# Patient Record
Sex: Female | Born: 1970 | Race: Black or African American | Hispanic: No | Marital: Single | Smoking: Never smoker
Health system: Southern US, Community
[De-identification: ages and names within clinical notes are randomized; demographics above are authoritative.]

## PROBLEM LIST (undated history)

## (undated) DIAGNOSIS — Z789 Other specified health status: Secondary | ICD-10-CM

---

## 1998-09-14 ENCOUNTER — Emergency Department (HOSPITAL_COMMUNITY): Admission: EM | Admit: 1998-09-14 | Discharge: 1998-09-14 | Payer: Self-pay | Admitting: Emergency Medicine

## 1999-07-10 ENCOUNTER — Encounter: Payer: Self-pay | Admitting: Emergency Medicine

## 1999-07-10 ENCOUNTER — Emergency Department (HOSPITAL_COMMUNITY): Admission: EM | Admit: 1999-07-10 | Discharge: 1999-07-10 | Payer: Self-pay | Admitting: Emergency Medicine

## 1999-11-07 ENCOUNTER — Emergency Department (HOSPITAL_COMMUNITY): Admission: EM | Admit: 1999-11-07 | Discharge: 1999-11-07 | Payer: Self-pay

## 2000-01-05 ENCOUNTER — Emergency Department (HOSPITAL_COMMUNITY): Admission: EM | Admit: 2000-01-05 | Discharge: 2000-01-05 | Payer: Self-pay | Admitting: Internal Medicine

## 2001-08-15 ENCOUNTER — Other Ambulatory Visit: Admission: RE | Admit: 2001-08-15 | Discharge: 2001-08-15 | Payer: Self-pay | Admitting: *Deleted

## 2001-09-17 ENCOUNTER — Emergency Department (HOSPITAL_COMMUNITY): Admission: EM | Admit: 2001-09-17 | Discharge: 2001-09-17 | Payer: Self-pay

## 2003-06-09 ENCOUNTER — Emergency Department (HOSPITAL_COMMUNITY): Admission: EM | Admit: 2003-06-09 | Discharge: 2003-06-09 | Payer: Self-pay | Admitting: Emergency Medicine

## 2005-05-11 ENCOUNTER — Emergency Department (HOSPITAL_COMMUNITY): Admission: EM | Admit: 2005-05-11 | Discharge: 2005-05-11 | Payer: Self-pay | Admitting: Emergency Medicine

## 2006-07-15 ENCOUNTER — Emergency Department (HOSPITAL_COMMUNITY): Admission: EM | Admit: 2006-07-15 | Discharge: 2006-07-15 | Payer: Self-pay | Admitting: Emergency Medicine

## 2006-11-17 ENCOUNTER — Emergency Department (HOSPITAL_COMMUNITY): Admission: EM | Admit: 2006-11-17 | Discharge: 2006-11-17 | Payer: Self-pay | Admitting: Family Medicine

## 2006-12-06 ENCOUNTER — Inpatient Hospital Stay (HOSPITAL_COMMUNITY): Admission: AD | Admit: 2006-12-06 | Discharge: 2006-12-06 | Payer: Self-pay | Admitting: Gynecology

## 2007-02-23 ENCOUNTER — Encounter (INDEPENDENT_AMBULATORY_CARE_PROVIDER_SITE_OTHER): Payer: Self-pay | Admitting: *Deleted

## 2007-02-23 ENCOUNTER — Ambulatory Visit: Payer: Self-pay | Admitting: Family Medicine

## 2008-07-10 ENCOUNTER — Inpatient Hospital Stay (HOSPITAL_COMMUNITY): Admission: AD | Admit: 2008-07-10 | Discharge: 2008-07-10 | Payer: Self-pay | Admitting: Gynecology

## 2008-08-07 ENCOUNTER — Ambulatory Visit: Payer: Self-pay | Admitting: Obstetrics & Gynecology

## 2008-08-14 ENCOUNTER — Ambulatory Visit (HOSPITAL_COMMUNITY): Admission: RE | Admit: 2008-08-14 | Discharge: 2008-08-14 | Payer: Self-pay | Admitting: Obstetrics & Gynecology

## 2008-08-21 ENCOUNTER — Other Ambulatory Visit: Admission: RE | Admit: 2008-08-21 | Discharge: 2008-08-21 | Payer: Self-pay | Admitting: Family Medicine

## 2008-08-21 ENCOUNTER — Ambulatory Visit: Payer: Self-pay | Admitting: Obstetrics and Gynecology

## 2008-09-11 ENCOUNTER — Ambulatory Visit: Payer: Self-pay | Admitting: Obstetrics and Gynecology

## 2009-07-18 ENCOUNTER — Emergency Department (HOSPITAL_COMMUNITY): Admission: EM | Admit: 2009-07-18 | Discharge: 2009-07-18 | Payer: Self-pay | Admitting: Emergency Medicine

## 2009-07-23 ENCOUNTER — Other Ambulatory Visit: Admission: RE | Admit: 2009-07-23 | Discharge: 2009-07-23 | Payer: Self-pay | Admitting: Obstetrics and Gynecology

## 2009-07-23 ENCOUNTER — Ambulatory Visit: Payer: Self-pay | Admitting: Obstetrics and Gynecology

## 2009-07-23 ENCOUNTER — Encounter (INDEPENDENT_AMBULATORY_CARE_PROVIDER_SITE_OTHER): Payer: Self-pay | Admitting: Obstetrics & Gynecology

## 2009-07-27 ENCOUNTER — Ambulatory Visit (HOSPITAL_COMMUNITY): Admission: RE | Admit: 2009-07-27 | Discharge: 2009-07-27 | Payer: Self-pay | Admitting: Family Medicine

## 2009-09-09 ENCOUNTER — Ambulatory Visit: Payer: Self-pay | Admitting: Obstetrics & Gynecology

## 2009-10-02 ENCOUNTER — Ambulatory Visit: Payer: Self-pay | Admitting: Obstetrics & Gynecology

## 2009-10-02 ENCOUNTER — Ambulatory Visit (HOSPITAL_COMMUNITY): Admission: RE | Admit: 2009-10-02 | Discharge: 2009-10-02 | Payer: Self-pay | Admitting: Obstetrics & Gynecology

## 2009-11-04 ENCOUNTER — Ambulatory Visit: Payer: Self-pay | Admitting: Obstetrics and Gynecology

## 2010-01-06 ENCOUNTER — Ambulatory Visit: Payer: Self-pay | Admitting: Obstetrics and Gynecology

## 2010-08-30 ENCOUNTER — Emergency Department (HOSPITAL_COMMUNITY): Admission: EM | Admit: 2010-08-30 | Discharge: 2010-08-30 | Payer: Self-pay | Admitting: Emergency Medicine

## 2010-09-01 ENCOUNTER — Ambulatory Visit: Payer: Self-pay | Admitting: Obstetrics and Gynecology

## 2010-09-01 ENCOUNTER — Inpatient Hospital Stay (HOSPITAL_COMMUNITY): Admission: AD | Admit: 2010-09-01 | Discharge: 2010-09-01 | Payer: Self-pay | Admitting: Obstetrics & Gynecology

## 2010-09-03 ENCOUNTER — Inpatient Hospital Stay (HOSPITAL_COMMUNITY): Admission: AD | Admit: 2010-09-03 | Discharge: 2010-09-03 | Payer: Self-pay | Admitting: Obstetrics & Gynecology

## 2010-09-03 ENCOUNTER — Ambulatory Visit: Payer: Self-pay | Admitting: Nurse Practitioner

## 2010-09-15 ENCOUNTER — Ambulatory Visit: Payer: Self-pay | Admitting: Obstetrics & Gynecology

## 2010-11-01 ENCOUNTER — Ambulatory Visit (HOSPITAL_COMMUNITY): Admission: RE | Admit: 2010-11-01 | Discharge: 2010-11-01 | Payer: Self-pay | Admitting: Family Medicine

## 2010-11-27 ENCOUNTER — Inpatient Hospital Stay (HOSPITAL_COMMUNITY)
Admission: AD | Admit: 2010-11-27 | Discharge: 2010-11-27 | Payer: Self-pay | Source: Home / Self Care | Admitting: Obstetrics & Gynecology

## 2010-11-28 ENCOUNTER — Inpatient Hospital Stay (HOSPITAL_COMMUNITY)
Admission: AD | Admit: 2010-11-28 | Discharge: 2010-11-28 | Payer: Self-pay | Source: Home / Self Care | Admitting: Obstetrics & Gynecology

## 2010-11-29 ENCOUNTER — Ambulatory Visit: Payer: Self-pay | Admitting: Family Medicine

## 2010-11-30 ENCOUNTER — Inpatient Hospital Stay (HOSPITAL_COMMUNITY)
Admission: AD | Admit: 2010-11-30 | Discharge: 2010-11-30 | Payer: Self-pay | Source: Home / Self Care | Admitting: Obstetrics and Gynecology

## 2010-12-01 ENCOUNTER — Ambulatory Visit (HOSPITAL_COMMUNITY)
Admission: AD | Admit: 2010-12-01 | Discharge: 2010-12-01 | Payer: Self-pay | Source: Home / Self Care | Admitting: Obstetrics and Gynecology

## 2010-12-16 ENCOUNTER — Ambulatory Visit: Payer: Self-pay | Admitting: Family Medicine

## 2010-12-26 HISTORY — PX: DILATION AND CURETTAGE OF UTERUS: SHX78

## 2011-01-15 ENCOUNTER — Ambulatory Visit (HOSPITAL_COMMUNITY)
Admission: AD | Admit: 2011-01-15 | Discharge: 2011-01-15 | Payer: Self-pay | Source: Home / Self Care | Attending: Obstetrics & Gynecology | Admitting: Obstetrics & Gynecology

## 2011-01-18 LAB — URINALYSIS, ROUTINE W REFLEX MICROSCOPIC
Bilirubin Urine: NEGATIVE
Hgb urine dipstick: NEGATIVE
Nitrite: NEGATIVE
Specific Gravity, Urine: >1.03 — ABNORMAL HIGH (ref 1.005–1.030)
Urine Glucose, Fasting: NEGATIVE mg/dL
pH: 5.5 (ref 5.0–8.0)

## 2011-01-18 LAB — RAPID URINE DRUG SCREEN, HOSP PERFORMED
Amphetamines: NOT DETECTED
Cocaine: NOT DETECTED
Opiates: POSITIVE — AB
Tetrahydrocannabinol: POSITIVE — AB

## 2011-01-18 LAB — CBC
HCT: 37.5 % (ref 36.0–46.0)
MCH: 32 pg (ref 26.0–34.0)
MCV: 91.7 fL (ref 78.0–100.0)
Platelets: 395 10*3/uL (ref 150–400)
RDW: 13.3 % (ref 11.5–15.5)

## 2011-01-18 LAB — COMPREHENSIVE METABOLIC PANEL
ALT: 18 U/L (ref 0–35)
AST: 15 U/L (ref 0–37)
CO2: 23 mEq/L (ref 19–32)
Chloride: 107 mEq/L (ref 96–112)
GFR calc non Af Amer: >60 mL/min (ref >60)
Sodium: 138 mEq/L (ref 135–145)

## 2011-01-18 LAB — DIFFERENTIAL
Eosinophils Absolute: 0 10*3/uL (ref 0.0–0.7)
Eosinophils Relative: 0 % (ref 0–5)
Lymphs Abs: 2.2 10*3/uL (ref 0.7–4.0)

## 2011-01-18 LAB — POCT PREGNANCY, URINE: Preg Test, Ur: NEGATIVE

## 2011-01-19 LAB — CBC
HCT: 34.3 % — ABNORMAL LOW (ref 36.0–46.0)
Hemoglobin: 11.5 g/dL — ABNORMAL LOW (ref 12.0–15.0)
MCH: 31.2 pg (ref 26.0–34.0)
MCV: 93 fL (ref 78.0–100.0)
RBC: 3.69 MIL/uL — ABNORMAL LOW (ref 3.87–5.11)

## 2011-01-26 ENCOUNTER — Other Ambulatory Visit: Payer: Self-pay | Admitting: Family Medicine

## 2011-01-26 ENCOUNTER — Ambulatory Visit (HOSPITAL_COMMUNITY)
Admission: RE | Admit: 2011-01-26 | Discharge: 2011-01-27 | Disposition: A | Discharge status: Home / Self Care | Payer: Medicaid Other | Source: Ambulatory Visit | Attending: Family Medicine | Admitting: Family Medicine

## 2011-01-26 DIAGNOSIS — D259 Leiomyoma of uterus, unspecified: Secondary | ICD-10-CM

## 2011-01-26 DIAGNOSIS — N920 Excessive and frequent menstruation with regular cycle: Secondary | ICD-10-CM

## 2011-01-26 DIAGNOSIS — N949 Unspecified condition associated with female genital organs and menstrual cycle: Secondary | ICD-10-CM | POA: Insufficient documentation

## 2011-01-26 HISTORY — PX: ABDOMINAL HYSTERECTOMY: SHX81

## 2011-01-27 LAB — CBC
MCH: 31.2 pg (ref 26.0–34.0)
MCHC: 33.1 g/dL (ref 30.0–36.0)
Platelets: 319 10*3/uL (ref 150–400)
RDW: 13.6 % (ref 11.5–15.5)

## 2011-01-31 NOTE — Op Note (Signed)
Morgan [name], Morgan [name] NO.:  [id number]  MEDICAL RECORD NO.:  [id number]           PATIENT TYPE:  O  LOCATION:  9318                          [facility]ILITY:  [facility]  PHYSICIAN:  Patrich Heinze S. Shawnie Pons, M.D.   DATE OF BIRTH:  [birth year]  DATE OF PROCEDURE:  01/26/2011 DATE OF DISCHARGE:                              OPERATIVE REPORT   PREOPERATIVE DIAGNOSES:  Fibroid uterus, recurrent menometrorrhagia.  POSTOPERATIVE DIAGNOSES:  Fibroid uterus, recurrent menometrorrhagia.  PROCEDURE:  A laparoscopic supracervical hysterectomy.  SURGEON:  Shelbie Proctor. Shawnie Pons, M.D.  ASSISTANT:  Lazaro Arms, M.D. and Lesly Dukes, M.D.  ANESTHESIA:  General local, Dr. Malen Gauze.  FINDINGS:  340 grams of fibroid uterus.  SPECIMEN:  Morcellated uterus to Pathology.  BLOOD LOSS:  450 mL.  COMPLICATIONS:  None immediately known.  REASON FOR PROCEDURE:  Briefly, the patient is a 40 year old, so underwent a NovaSure endometrial ablation in October 10.  She did well until about 6 months postop and she developed hematometra.  She has been drained approximately 4 times, and has recurrence with significant abdominal pain related to this hematometra and she desired more definitive treatment.  She was counselled regarding risks and benefits of this procedure including but not limited to bleeding, infection, injury to surrounding structures including bowel, bladder, or ureters. After leaving the cervix, continued monthly spotting.  The patient understood all these risks and agreed to proceed.  PROCEDURE:  The patient was taken to the OR where she was placed in dorsal lithotomy in [place] stirrups.  She was prepped and draped in the usual sterile fashion.  SCDs were in place, 1 g of Ancef had been infused.  A time-out was performed.  A Foley catheter was placed inside the bladder.  A speculum was placed inside the vagina.  The cervix was visualized, grasped anteriorly with a single-tooth tenaculum and  an acorn tenaculum was placed through the cervix and hooked to the aforementioned single-tooth.  Attention was then turned to the abdomen. A hernia was noted in the anterior abdominal wall.  A knife was used to make a incision in the abdomen at the umbilicus after injection of local anesthesia.  This incision was carried down to underlying fascia which was incised.  The peritoneal cavity was entered bluntly with a Kelly clamp.  Edges of the umbilical incision were then grasped and tied with a 0 Vicryl suture on a UR-6.  Hasson trocar was placed inside this incision.  The camera was inserted into the abdomen.  Two 10- mm Excel trocars were used to place right and left lower quadrant ports under direct visualization after injection with local anesthetic.  At this point, a single-tooth tenaculum was used to elevate the uterus.  The round ligament was taken on the patient's left side with the harmonic scalpel.  This was carried down to the bladder flap.  The anterior leaf of the broad ligament could be entered.  Hydrodissection was then performed and the harmonic was used to create a bladder flap across the front of the uterus.  The tubo-ovarian pedicle was taken down on this side until  the uterine artery was easily visualized.  Similarly, the patient's right round ligament was taken down with harmonic scalpel. The anterior leaf of the broad entered, hydrodissection done, and the harmonic used to create bladder flap from this side to the midline.  The bladder was markedly adherent in the midline.  Attention was then turned to the tubo-ovarian pedicle on this side which was taken down, some bleeding was encountered on this side and then the suction irrigator was not really working very well.  A midline suprapubic port was inserted  under direct visualization using an Excel trocar. Eventually, the bleeding did cease without any further intervention on this side.  The patient's right uterine  artery was visualized.  The harmonic scalpel was used to cauterize this until transection was made.  The patient's left uterine artery was very large and easily identified.  It was clamped and cauterized with the harmonic, up and down in several places high on the uterus.  However, once it was transected it began bleeding rather profusely and a laparoscopic vascular clip was used and excellent hemostasis was obtained.  From this point on, one more bite was taken down the broad ligament on each side with harmonic scalpel and then the corpus of the uterus was removed by drilling in with a harmonic, and then closing the blades anteriorly and posteriorly in about 4 bites. Once this was done, the left lower quadrant port was removed, and the morcellator was placed through this.  The uterus was removed in pieces, quite a few pieces were required to get the entirety of the uterus out.  Any extra pieces were then removed without difficulty.  Suction irrigation was done in the pelvis.  All pedicles appeared dry.  The acorn was visualized and it was felt that not much cervix was left.  A piece of Intercede was placed over the cervix.  The fascia closed in the left  lower quadrant where the morcellator had been and was closed with an  endostitch.   So all trocars were removed, and the umbilical Hasson trocar was removed as well.  The pneumoperitoneum was let down. Once at the umbilicus, two figure-of-eights were used to close the fascia at the umbilicus using the aforementioned 0 Vicryl on a UR-6. The skin was closed with 3-0 Vicryl on an X1 in a subcuticular fashion followed by Dermabond.  The other lower quadrant ports,  3 of them were closed with 3-0 Vicryl in a subcuticular fashion and Dermabond was placed over each of these incisions. The single-tooth tenaculum and acorn tenaculum were removed from the patient's cervix.  The patient tolerated the procedure well.  All instrument, needle and lap  counts were correct x2.  The patient was awakened and taken to recovery in stable condition.     Shelbie Proctor. Shawnie Pons, M.D.     TSP/MEDQ  D:  01/26/2011  T:  01/27/2011  Job:  [id number]  Electronically Signed by Tinnie Gens M.D. on 01/31/2011 09:07:57 AM

## 2011-01-31 NOTE — Discharge Summary (Signed)
  NAMESREYA, FROIO NO.:  [id number]  MEDICAL RECORD NO.:  [id number]           PATIENT TYPE:  O  LOCATION:  9318                          [facility]  [facility]  PHYSICIAN:  Issiac Jamar S. Shawnie Pons, M.D.   DATE OF BIRTH:  [birth year]  DATE OF ADMISSION:  01/26/2011 DATE OF DISCHARGE:  01/27/2011                              DISCHARGE SUMMARY   FINAL DIAGNOSES: 1. Recurrent hematometra. 2. Pelvic pain. 3. Fibroid uterus. 4. Obesity. 5. Umbilical hernia.  PERTINENT LABORATORY VALUES:  A preoperative hemoglobin of 11.5, postoperative hemoglobin of 8.8.  Negative urine pregnancy test. Negative MRSA screen.  Other pertinent findings include an enlarged uterus with multiple fibroids.  REASON FOR ADMISSION:  Briefly, please see H and P on the chart.  The patient is a 40 year old G2, P2 who underwent NovaSure ablation in October 2010.  She subsequently had recurrent hematometra that had to be drained x3 in the ED and OR.  She had worsening abdominal pain requiring Dilaudid continuously and was admitted for definitive surgery.  PROCEDURES:  The patient underwent laparoscopic supracervical hysterectomy.  HOSPITAL COURSE:  The patient was admitted for the above procedure. Please see separate dictated operative report for full details of this procedure.  Postoperatively, the patient was transferred to the floor. She remained afebrile, had excellent urine output with stable vital signs.  The patient on postoperative day #1 was tolerating a regular diet.  She had passed no flatus.  She was ambulating in the halls and had done so 5 times on this day.  She did tolerate breakfast and lunch with increasing gas but no nausea and no vomiting.  Her IV was discontinued as was her PCA and she desired to go home.  DISCHARGE DISPOSITION AND CONDITION:  The patient discharged home in good condition.  Followup will be in 3 weeks on February 16, 2011, at 3 o'clock p.m. with Dr. Shawnie Pons in the  GYN Clinic.  Discharge medications include resumption of Aleve, naproxen, sodium 220 mg by mouth twice daily, Dilaudid 2 mg one every 4 hours as needed for pain.  A new prescription for this was written for the patient for postoperative stuff. Additionally, the patient has received a note for work same as she will be out for at least up to 4 weeks secondary to her procedure.  The patient is instructed to return with fever, chills, severe abdominal pain, persistent nausea and vomiting.  All instructions were given to the patient as were prescriptions.     Shelbie Proctor. Shawnie Pons, M.D.     TSP/MEDQ  D:  01/27/2011  T:  01/28/2011  Job:  [id number]  Electronically Signed by Tinnie Gens M.D. on 01/31/2011 09:08:14 AM

## 2011-02-09 NOTE — Op Note (Signed)
  NAME:  Morgan [name], Morgan [name] NO.:  [id number]  MEDICAL RECORD NO.:  [id number]          PATIENT TYPE:  AMB  LOCATION:  [facility]                          [facility]:  [facility]  PHYSICIAN:  Lesly Dukes, M.D. DATE OF BIRTH:  June 23, 1971  DATE OF PROCEDURE:  01/15/2011 DATE OF DISCHARGE:  01/15/2011                              OPERATIVE REPORT   PREOPERATIVE DIAGNOSES:  A 40 year old female with hematometra, status post ablation with severe pelvic pain unresponsive to pain medication.  POSTOPERATIVE DIAGNOSES:  A 40 year old female with hematometra, status post ablation with severe pelvic pain unresponsive to pain medication.  PROCEDURE:  Dilation and curettage.  SURGEON:  Lesly Dukes, MD  ANESTHESIA:  General.  FINDINGS:  A 3.5-cm hematometra on ultrasound.  SPECIMENS:  Endometrial curettings to Pathology.  ESTIMATED BLOOD LOSS:  No EBL from procedure just a blood from the hematometra.  COMPLICATIONS:  None. PROCEDURE:  After informed consent was obtained, the patient was taken to the operating room and placed in dorsal lithotomy position.  The patient was prepped and draped with normal sterile fashion.  A time-out was done.  A bivalve speculum was placed in the patient's vagina.  The cervix was brought into view under ultrasound guidance.  The cervical os was dilated.  There was scarring at the internal os and this was broken through with the dilators.  Again, under ultrasound guidance, we dilated to a #7-1/2 Hegar dilator.  A 6 flexible suction curette was gently introduced into the uterus and suction curettage was performed.  The thick black old blood was easily suctioned out of the uterus.  There was no bleeding from the uterus with the procedure and endometrial stripe was very thin on ultrasound.  The patient tolerated the procedure well. Sponge, lap, instrument and needle count correct x2 and the patient went to the recovery room in stable condition.   The patient was going to receive Depo on discharge and has hysterectomy scheduled for January 26, 2011.     Lesly Dukes, M.D.     Lora Paula  D:  01/15/2011  T:  01/16/2011  Job:  [id number]  Electronically Signed by Elsie Lincoln M.D. on 02/09/2011 07:10:45 PM

## 2011-02-16 ENCOUNTER — Ambulatory Visit: Payer: Self-pay | Admitting: Family Medicine

## 2011-02-16 DIAGNOSIS — Z09 Encounter for follow-up examination after completed treatment for conditions other than malignant neoplasm: Secondary | ICD-10-CM

## 2011-03-07 LAB — CBC
HCT: 38.5 % (ref 36.0–46.0)
HCT: 40.1 % (ref 36.0–46.0)
Hemoglobin: 13 g/dL (ref 12.0–15.0)
Hemoglobin: 13.7 g/dL (ref 12.0–15.0)
MCH: 33 pg (ref 26.0–34.0)
MCV: 96.8 fL (ref 78.0–100.0)
RBC: 3.96 MIL/uL (ref 3.87–5.11)
RBC: 4.14 MIL/uL (ref 3.87–5.11)
WBC: 10.8 10*3/uL — ABNORMAL HIGH (ref 4.0–10.5)

## 2011-03-07 LAB — URINALYSIS, ROUTINE W REFLEX MICROSCOPIC
Bilirubin Urine: NEGATIVE
Ketones, ur: 15 mg/dL — AB
Leukocytes, UA: NEGATIVE
Nitrite: NEGATIVE
Protein, ur: NEGATIVE mg/dL

## 2011-03-07 LAB — URINE MICROSCOPIC-ADD ON

## 2011-03-07 LAB — POCT PREGNANCY, URINE: Preg Test, Ur: NEGATIVE

## 2011-03-10 LAB — COMPREHENSIVE METABOLIC PANEL
ALT: 18 U/L (ref 0–35)
AST: 16 U/L (ref 0–37)
Albumin: 3.8 g/dL (ref 3.5–5.2)
Alkaline Phosphatase: 52 U/L (ref 39–117)
Alkaline Phosphatase: 47 U/L (ref 39–117)
BUN: 13 mg/dL (ref 6–23)
Calcium: 9 mg/dL (ref 8.4–10.5)
Chloride: 105 mEq/L (ref 96–112)
GFR calc Af Amer: >60 mL/min (ref >60)
GFR calc non Af Amer: >60 mL/min (ref >60)
Glucose, Bld: 101 mg/dL — ABNORMAL HIGH (ref 70–99)
Potassium: 3.7 mEq/L (ref 3.5–5.1)
Sodium: 137 mEq/L (ref 135–145)
Total Bilirubin: 0.6 mg/dL (ref 0.3–1.2)
Total Protein: 8.4 g/dL — ABNORMAL HIGH (ref 6.0–8.3)
Total Protein: 8.4 g/dL — ABNORMAL HIGH (ref 6.0–8.3)

## 2011-03-10 LAB — URINALYSIS, ROUTINE W REFLEX MICROSCOPIC
Hgb urine dipstick: NEGATIVE
Specific Gravity, Urine: 1.019 (ref 1.005–1.030)
Urobilinogen, UA: 1 mg/dL (ref 0.0–1.0)

## 2011-03-10 LAB — DIFFERENTIAL
Basophils Absolute: 0.1 10*3/uL (ref 0.0–0.1)
Basophils Relative: 2 % — ABNORMAL HIGH (ref 0–1)
Basophils Relative: 1 % (ref 0–1)
Eosinophils Absolute: 0.1 10*3/uL (ref 0.0–0.7)
Eosinophils Relative: 1 % (ref 0–5)
Eosinophils Relative: 0 % (ref 0–5)
Lymphocytes Relative: 28 % (ref 12–46)
Monocytes Absolute: 0.6 10*3/uL (ref 0.1–1.0)
Monocytes Relative: 7 % (ref 3–12)
Neutrophils Relative %: 63 % (ref 43–77)

## 2011-03-10 LAB — CBC
HCT: 38.7 % (ref 36.0–46.0)
MCHC: 34.3 g/dL (ref 30.0–36.0)
MCV: 98.1 fL (ref 78.0–100.0)
MCV: 96.2 fL (ref 78.0–100.0)
Platelets: 337 10*3/uL (ref 150–400)
RBC: 3.99 MIL/uL (ref 3.87–5.11)
RDW: 14.3 % (ref 11.5–15.5)
RDW: 14 % (ref 11.5–15.5)
WBC: 8.9 10*3/uL (ref 4.0–10.5)

## 2011-03-16 ENCOUNTER — Ambulatory Visit: Payer: Self-pay | Admitting: Obstetrics & Gynecology

## 2011-03-18 NOTE — Progress Notes (Signed)
NAME:  Morgan [name], Morgan [name] NO.:  [id number]  MEDICAL RECORD NO.:  [id number]           PATIENT TYPE:  A  LOCATION:  [facility]                   FACILITY:  [facility]  PHYSICIAN:  Tinnie Gens, MD        DATE OF BIRTH:  Nov 01, 1971  DATE OF SERVICE:  02/16/2011                                 CLINIC NOTE  CHIEF COMPLAINT:  Postop followup.  HISTORY OF PRESENT ILLNESS:  The patient is a 40 year old gravida 2, para 2 who underwent supracervical laparoscopic supracervical hysterectomy for fibroid uterus and persistent hematometra who is here for a 2-week postop check.  She feels much better.  All of her pain has gone.  The uterus was approximately 326 g, came out fine.  Pathology is reviewed, shows leiomyoma, secretory-type endometrium, normal serosa.  On exam, incisions are healing well.  Abdomen is soft and flat.  IMPRESSION:  Status post laparoscopic supracervical hysterectomy for persistent hematometra following NovaSure ablation, doing well.  PLAN:  Follow up in 4 weeks for final postop check.          ______________________________ Tinnie Gens, MD    TP/MEDQ  D:  02/16/2011  T:  02/17/2011  Job:  [id number]

## 2011-03-31 LAB — CBC
HCT: 35.4 % — ABNORMAL LOW (ref 36.0–46.0)
MCHC: 34.1 g/dL (ref 30.0–36.0)
MCV: 97.4 fL (ref 78.0–100.0)
Platelets: 366 10*3/uL (ref 150–400)
WBC: 8.9 10*3/uL (ref 4.0–10.5)

## 2011-03-31 LAB — PREGNANCY, URINE: Preg Test, Ur: NEGATIVE

## 2011-04-03 LAB — LIPASE, BLOOD: Lipase: 19 U/L (ref 11–59)

## 2011-04-03 LAB — CBC
HCT: 37 % (ref 36.0–46.0)
Hemoglobin: 12.3 g/dL (ref 12.0–15.0)
MCHC: 33.1 g/dL (ref 30.0–36.0)
MCV: 97.2 fL (ref 78.0–100.0)
Platelets: 313 K/uL (ref 150–400)
RBC: 3.81 MIL/uL — ABNORMAL LOW (ref 3.87–5.11)
RDW: 14 % (ref 11.5–15.5)
WBC: 7.9 K/uL (ref 4.0–10.5)

## 2011-04-03 LAB — COMPREHENSIVE METABOLIC PANEL WITH GFR
ALT: 15 U/L (ref 0–35)
AST: 17 U/L (ref 0–37)
Albumin: 3.4 g/dL — ABNORMAL LOW (ref 3.5–5.2)
Alkaline Phosphatase: 46 U/L (ref 39–117)
BUN: 12 mg/dL (ref 6–23)
CO2: 25 meq/L (ref 19–32)
Calcium: 8.9 mg/dL (ref 8.4–10.5)
Chloride: 110 meq/L (ref 96–112)
Creatinine, Ser: 0.91 mg/dL (ref 0.4–1.2)
GFR calc Af Amer: >60 mL/min (ref >60)
GFR calc non Af Amer: >60 mL/min (ref >60)
Glucose, Bld: 92 mg/dL (ref 70–99)
Potassium: 3.8 meq/L (ref 3.5–5.1)
Sodium: 140 meq/L (ref 135–145)
Total Bilirubin: 0.4 mg/dL (ref 0.3–1.2)
Total Protein: 7.1 g/dL (ref 6.0–8.3)

## 2011-04-03 LAB — DIFFERENTIAL
Basophils Absolute: 0.1 K/uL (ref 0.0–0.1)
Basophils Relative: 2 % — ABNORMAL HIGH (ref 0–1)
Eosinophils Absolute: 0.1 K/uL (ref 0.0–0.7)
Eosinophils Relative: 1 % (ref 0–5)
Lymphocytes Relative: 40 % (ref 12–46)
Lymphs Abs: 3.2 K/uL (ref 0.7–4.0)
Monocytes Absolute: 0.8 K/uL (ref 0.1–1.0)
Monocytes Relative: 10 % (ref 3–12)
Neutro Abs: 3.8 K/uL (ref 1.7–7.7)
Neutrophils Relative %: 48 % (ref 43–77)

## 2011-04-03 LAB — POCT PREGNANCY, URINE: Preg Test, Ur: NEGATIVE

## 2011-05-10 NOTE — Group Therapy Note (Signed)
NAME:  QUINCEE, GITTENS NO.:  [id number]   MEDICAL RECORD NO.:  [id number]          PATIENT TYPE:  WOC   LOCATION:  [facility]                   FACILITY:  [facility]   PHYSICIAN:  Argentina Donovan, MD        DATE OF BIRTH:  14-Aug-1971   DATE OF SERVICE:  08/21/2008                                  CLINIC NOTE   The patient is a 40 year old para 2-0-0-2, post tubal ligation, who has  had heavy vaginal bleeding, was seen on August 07, 2008, by Dr. Penne Lash,  who had a CBC and TSH on the patient, the TSH was normal.  The CBC I  cannot find on the chart.  The patient was to come back today for the  results of her ultrasound which was essentially normal with the  exception of some small leiomyomata intramural on the uterus and what  appeared to be a normal endometrial cavity premenstrual and she had her  come back today for an endometrial biopsy which was done without  incident with the uterus sounding 7 cm.  She will come back in 2 weeks  for that.  Meanwhile, she put her on Loestrin 24 two a day and the  patient is still spotting lightly every day, so hopefully we can make  some kind of a decision when she comes back.  She may be a good  candidate for endometrial ablation.           ______________________________  Argentina Donovan, MD     PR/MEDQ  D:  08/21/2008  T:  08/21/2008  Job:  [id number]

## 2011-05-10 NOTE — Group Therapy Note (Signed)
NAME:  Morgan [name], Morgan [name] NO.:  [id number]   MEDICAL RECORD NO.:  [id number]          PATIENT TYPE:  WOC   LOCATION:  [facility]                   [facility]ACILITY:  [facility]   PHYSICIAN:  Elsie Lincoln, MD      DATE OF BIRTH:  1971/06/12   DATE OF SERVICE:  08/07/2008                                  CLINIC NOTE   The patient is a 40 year old para 2 female status post BTL who is a  follow-up from [facility] for heavy vaginal bleeding.  The patient has had  normal periods her whole life and actually was seen at the [facility] back in  2008 for the same problem.  She was placed on Femcon FE and did well  until she stopped a year later then the irregular bleeding began again.  She was placed on a 30 mcg pill but has been bleeding on and off through  that pack and she is now on the placebo pills and bleeding.  The patient  is done with childbearing but needs something done with her bleeding.  She is not interested in a hysterectomy at this time.  Given that she is  morbidly obese and has a history of abnormal periods for most of her  life, she does need an endometrial biopsy.  She is very scared and wants  to come back in 2-3 weeks for this.  In the meantime, we will get a CBC,  TSH and transvaginal ultrasound.  A transvaginal ultrasound several  years ago showed 2 small fibroids.   PAST MEDICAL HISTORY:  None.   PAST SURGICAL HISTORY:  None.   OBSTETRICAL HISTORY:  NSVD x1, C-section x1.   GYN HISTORY:  Denies abnormal Pap smears, does have fibroids, no ovarian  cysts.   FAMILY HISTORY:  Emelia Loron has diabetes.  High blood pressure is in  her grandfather and grandmother died of some type of cancer.   SOCIAL HISTORY:  She does work.  She drinks approximately 1 alcoholic  beverage a week.  No drugs or tobacco.   SYSTEMIC REVIEW:  Positive for weight gain.   MEDICATION:  Birth control pills called for Cryselle, which is a generic  birth control pill.   ALLERGIES:  None.   GENERAL:   Well-nourished, well-developed, no apparent distress.  Temperature 97.9, pulse 106, blood pressure 147/88 weight 254.9.  GENERAL:  Well-nourished, well-developed no apparent distress.  HEENT:  Normocephalic, atraumatic.  CHEST:  Normal movement.  ABDOMEN:  Obese, soft, nontender, no organomegaly, no hernia but  difficult to palpate anything secondary to habitus.  GENITALIA:  Tanner 5.  Vagina pink, normal rugae.  Bladder and urethra  well suspended.  Cervix closed, nontender.  Uterus difficult to palpate,  does not feel overly enlarged.  Adnexa no masses, nontender but again  difficult to palpate secondary to habitus.   ASSESSMENT/PLAN:  A 39 year old para 2 female with dysfunctional uterine  bleeding not doing well on birth control pills.  1. Endometrial biopsy needed.  2. CBC, TSH.  3. Transvaginal ultrasound.  4. The patient does not have insurance and only pills we have is  Loestrin Fe 24 and I am going to start her on this 2 pills a day, 1      in the morning and 1 at night, until she comes back for her      endometrial biopsy.  I am also going to start her on regular Iron      and Colace.  She is actively bleeding and at this point it seems      that she is anemic.  If she is not anemic now, will tell her to      stop the Iron.  5. The patient is to come back to 2-3 weeks for an endometrial biopsy      and go over test results.           ______________________________  Elsie Lincoln, MD     KL/MEDQ  D:  08/07/2008  T:  08/07/2008  Job:  [id number]

## 2011-05-10 NOTE — Group Therapy Note (Signed)
NAME:  Morgan [name], MCCAMMON NO.:  [id number]   MEDICAL RECORD NO.:  [id number]          PATIENT TYPE:  WOC   LOCATION:  [facility]                   [facility]ACILITY:  [facility]   PHYSICIAN:  Dorthula Perfect, MD     DATE OF BIRTH:  04/04/1971   DATE OF SERVICE:  07/23/2009                                  CLINIC NOTE   HISTORY OF PRESENT ILLNESS:  A 40 year old African American female  gravida 2, para 2 returns with an ongoing problem of heavy menstrual  periods.  She have seen here often on in the past.  She was seen here in  August 2009 by Dr. Tenny Craw.  She had an ultrasound done at that time, which  revealed an  enlarged uterus with a vaginal length of 13.5 cm.  She had  2 fibroids, one in the fundus measured 3.3 x 4.8 x 3.5 cm and the other  one was about half that size.  These fibroids had almost doubled in size  from the previous one in 2007.  In the past, she had been managed with  low-dose birth control pills, which do help keep her cyclic, does help  the bleeding somewhat, but she does not like the way she feels on the  pill.  This past year, off of the birth control pills she could be up to  having a 2-3 months interval between her periods.  Her menstrual periods  last approximately a week, the first 4-5 days are heavy.  She however  has no staining of her bed sheets.  Last menstrual period was June 23, 2009.  She has no intermenstrual spotting.  She has been brought by the  physicians in her other visits to be a good candidate for an endometrial  ablation.   She has no GI or GU complaints.   PAST SURGICAL HISTORY:  She has C-section with a tubal sterilization  with her second delivery.   ALLERGIES:  She has no allergies.   MEDICATIONS:  She is on no medication.   PHYSICAL EXAMINATION:  VITAL SIGNS:  Height 5 feet, 2, weight 262, blood  pressure 148/102, repeat blood pressure was 129/78.  A blood pressure  here a year or two ago was normal.  ABDOMEN:  Examination of  her abdomen reveals her to be obese with a  fairly significant panniculus.  No masses are felt.  PELVIC:  External genitalia, BUS and Skene glands are normal.  Vaginal  vault epithelialized.  The cervix, which was easily visualized was  normal.  The uterus was probably slightly enlarged, but this is very  difficult to feel because of the abdominal size.  I could not palpate  either ovary.  There were no adnexal masses noted.   IMPRESSION:  1. Abnormal uterine bleeding with significant dysmenorrhea.  2. Uterine fibroids.   DISPOSITION:  1. Pap smears since her last one was a couple years ago.  2. Endometrial biopsy simply to in order to have on record that      everything is okay, in case she has an ablation done.  The anterior  lip of the cervix was grasped with single tooth tenaculum.      Endocervical canal was sounded and then a tow like instrument      inserted to get about 12 cm.  She have a fair amount of cramping      with the procedure and has received ibuprofen.  3. She will have another ultrasound to check the size of these      fibroids.  4. She will return in 2 weeks for both biopsy result and ultrasound      result.  5. I discussed with her various options including birth control pills,      which would keep her cyclic, probably help with periods and would      help with her cramps.  Also the possibility of slight control with      Provera, which might help the amount of menstrual flow.  She would      possibly benefit from an endometrial ablation, although the uterus      size I suspect is getting is enlarged.  I explained to her what      hysterectomy means and that it is not necessarily removal of her      ovaries what she was fearful of because she is hesitant about      having that procedure done.           ______________________________  Dorthula Perfect, MD     ER/MEDQ  D:  07/23/2009  T:  07/24/2009  Job:  [id number]

## 2011-09-05 IMAGING — US US INTRAOPERATIVE - NO REPORT
1 series · 2 of 2 positions shown · non-contrast
Comparison: none

[Series 1: us intraoperative - no report · 0.21mm/px · 2 of 2 slices shown]
[im 1/2]
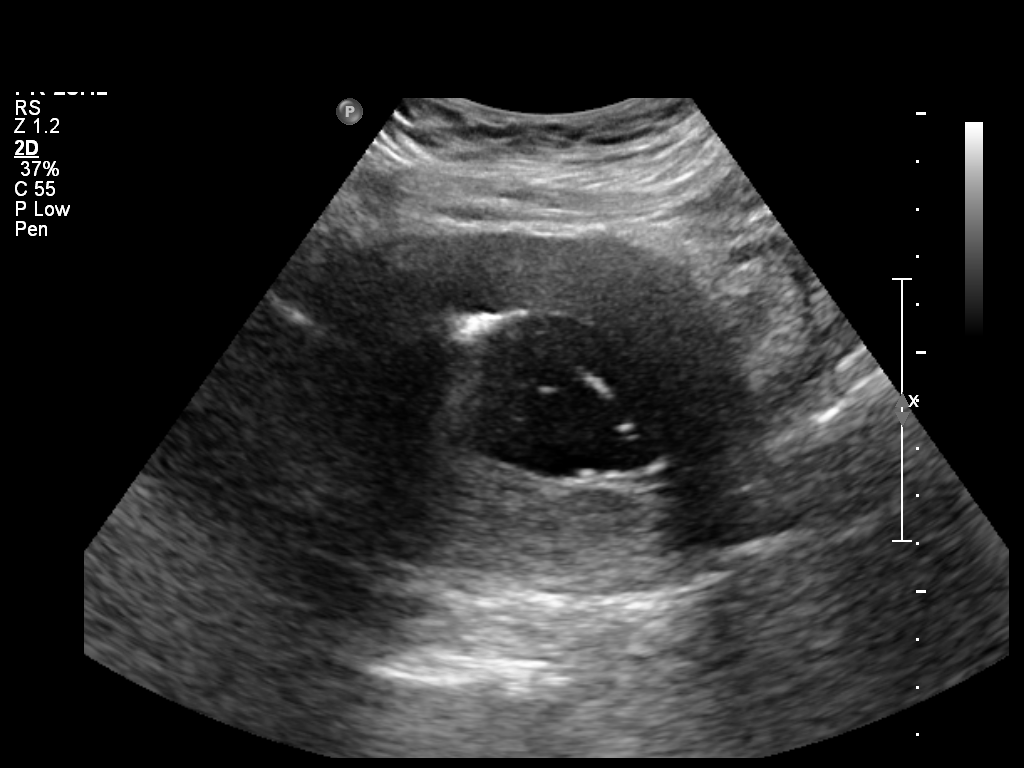
[im 2/2]
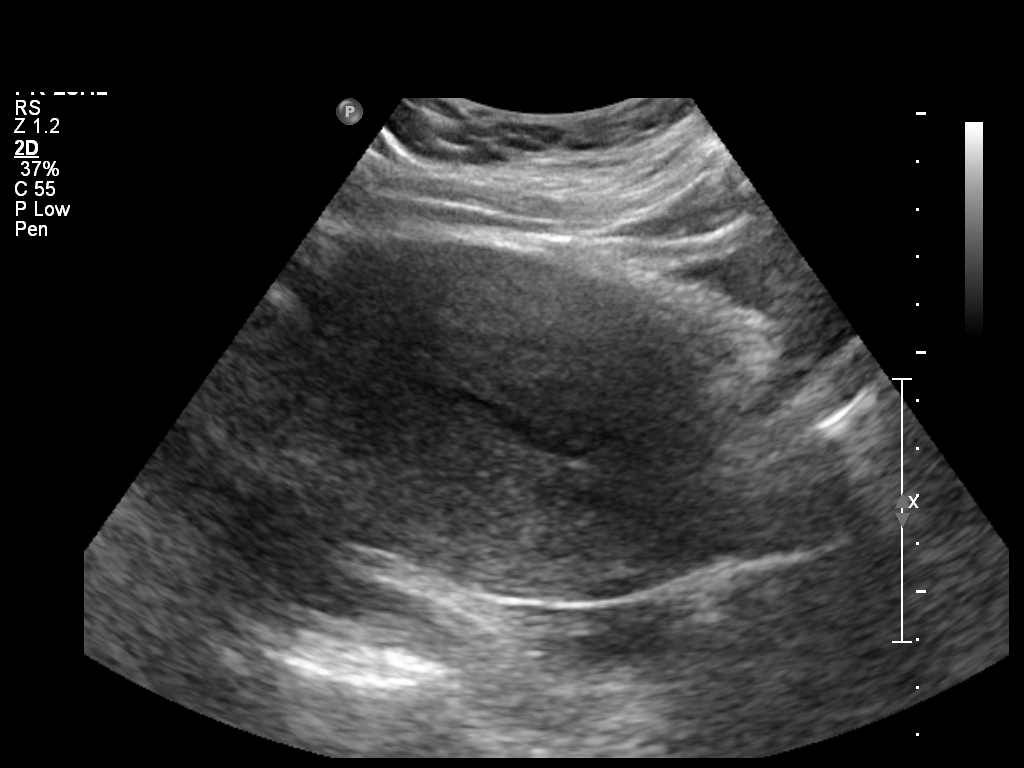

[2 of 2 positions shown; findings below may reference images not displayed]

Canned report from images found in remote index.

Refer to host system for actual result text.

## 2011-09-05 IMAGING — US US TRANSVAGINAL NON-OB
1 series · 13 of 25 positions shown · non-contrast
Comparison: 11/27/2010

CLINICAL DATA: Pelvic pain for 2 days.  Nausea, vomiting.  History
of anatomy tract.  History of uterine ablation of the 3343.  Prior
C-section.  Gravida 2, para 2.  History of hydrosalpinx.

TRANSVAGINAL ULTRASOUND OF PELVIS
TECHNIQUE: Transvaginal ultrasound examination of the pelvis was
performed including evaluation of the uterus, ovaries, adnexal
regions, and pelvic cul-de-sac.

[Series 1: us transvaginal non-ob · 0.12mm/px · 13 of 40 slices shown]
[im 1/40]
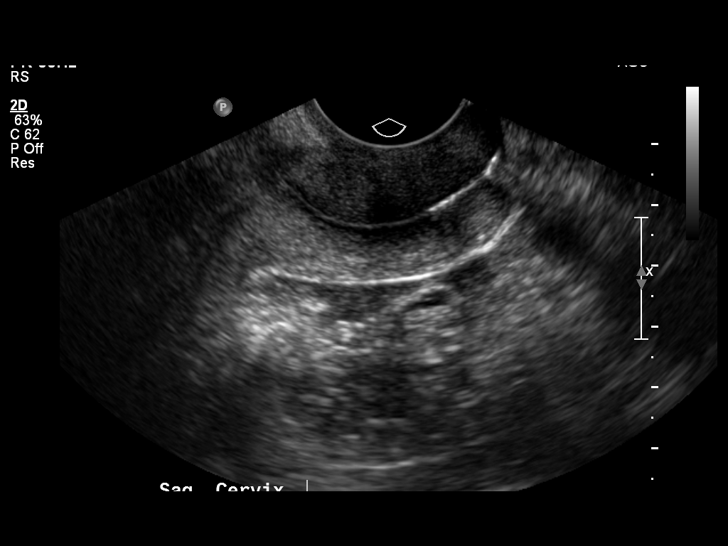
[im 4/40]
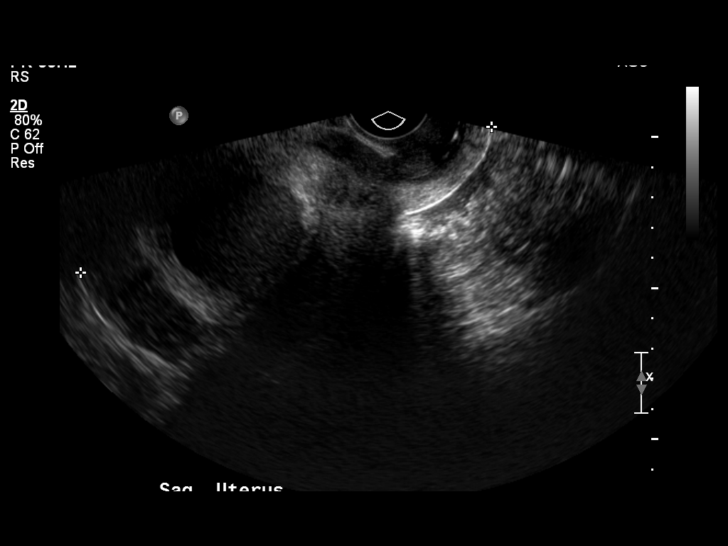
[im 7/40]
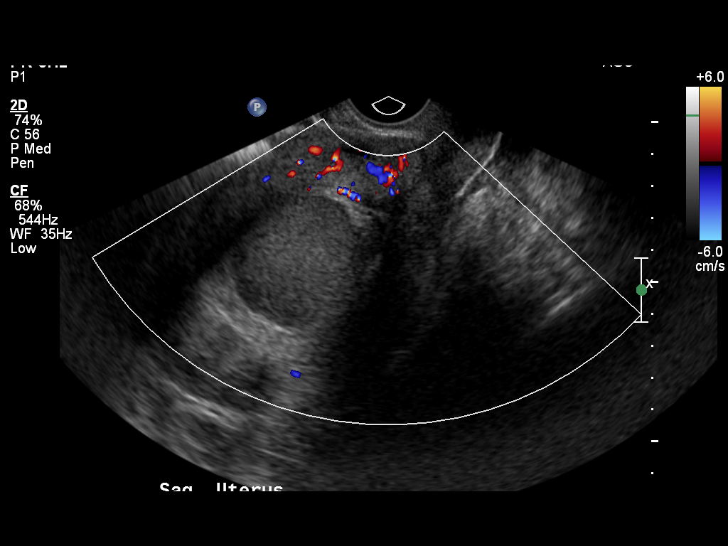
[im 10/40]
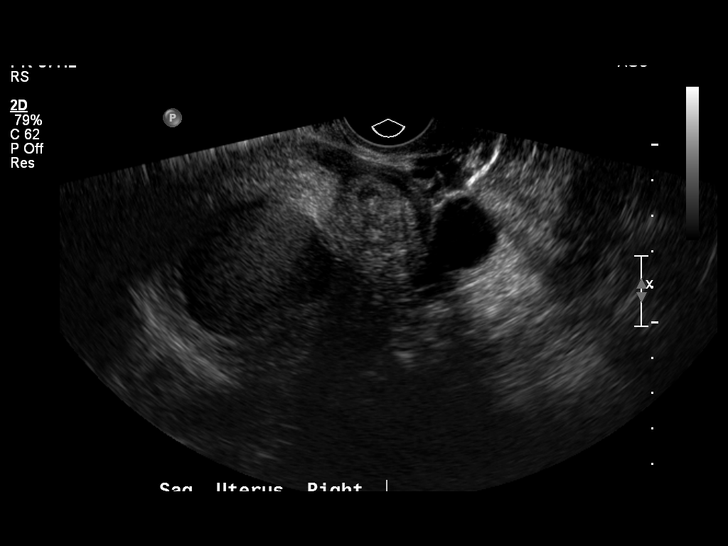
[im 14/40]
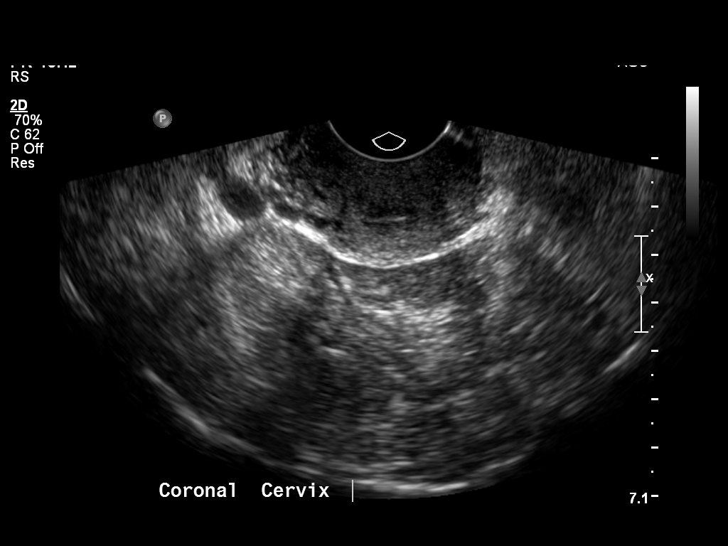
[im 17/40]
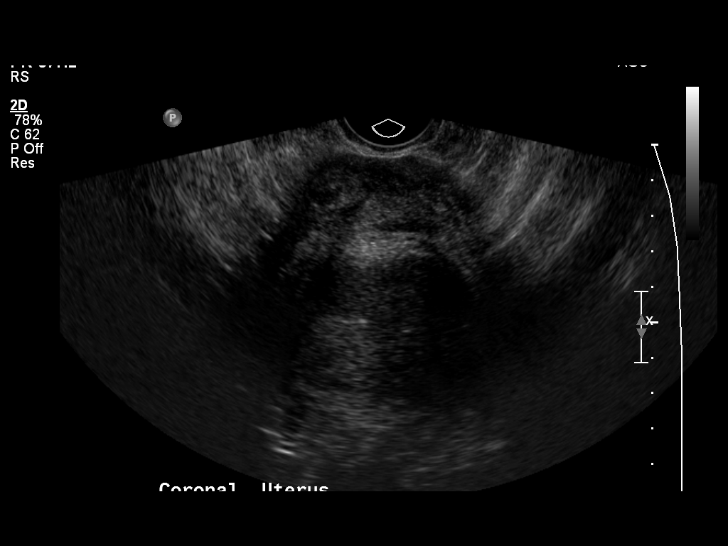
[im 20/40]
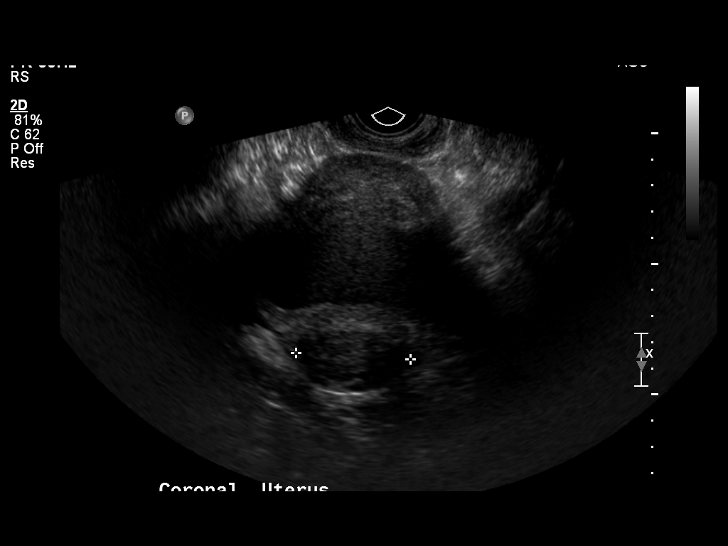
[im 23/40]
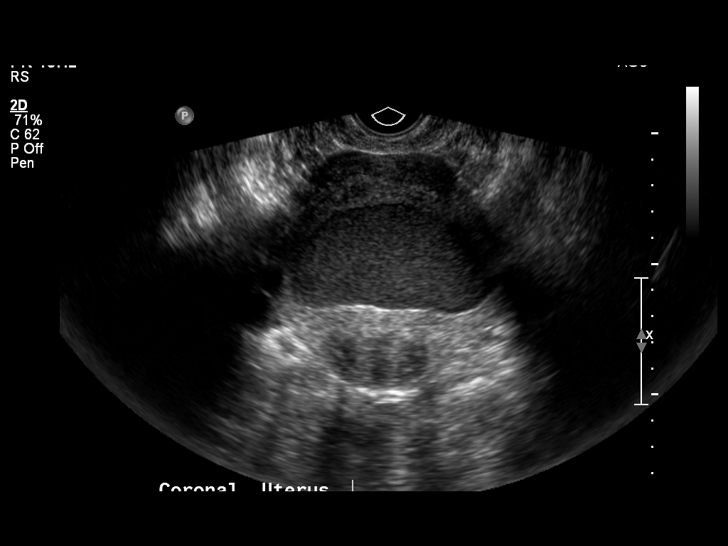
[im 27/40]
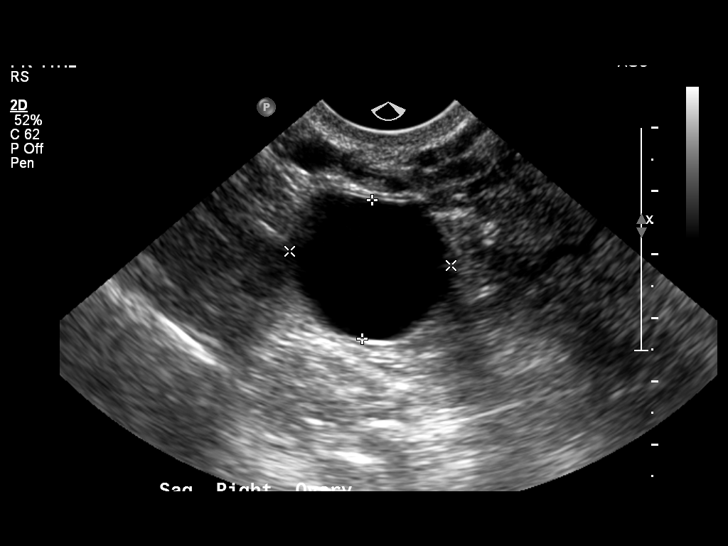
[im 30/40]
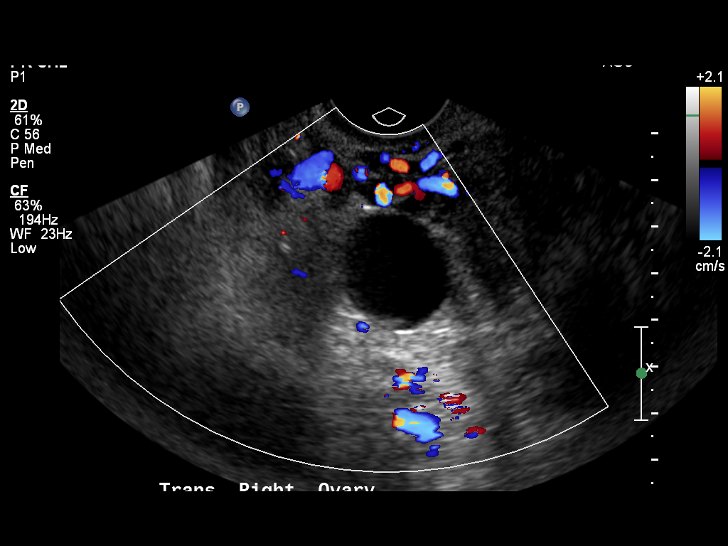
[im 33/40]
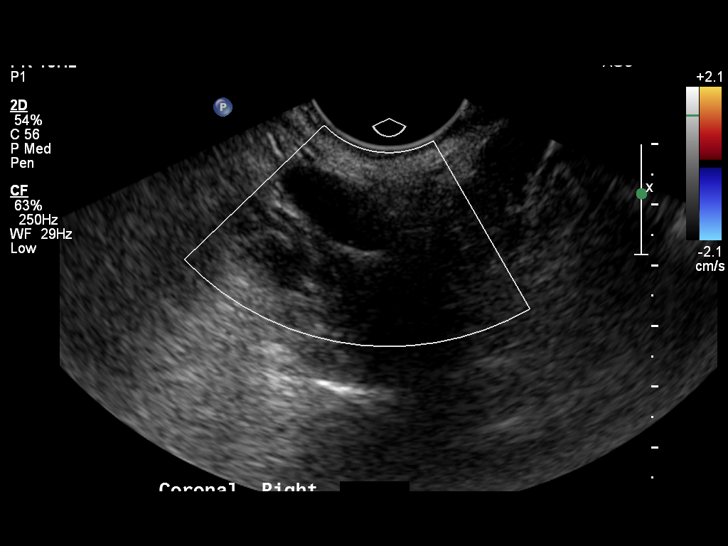
[im 36/40]
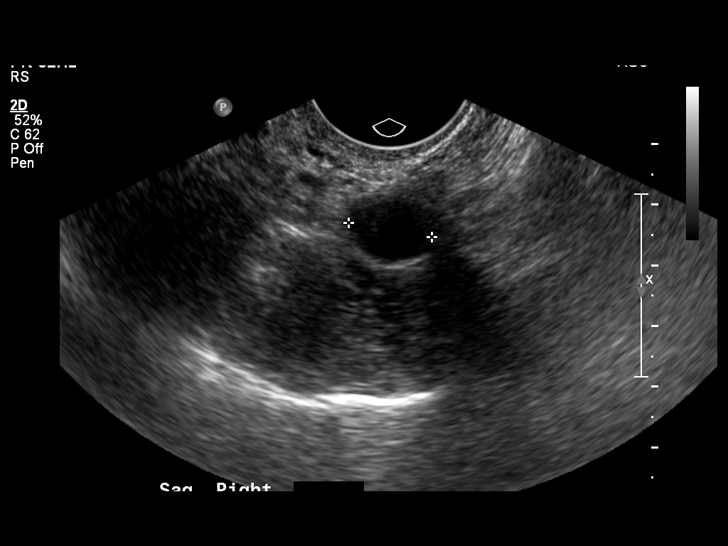
[im 40/40]
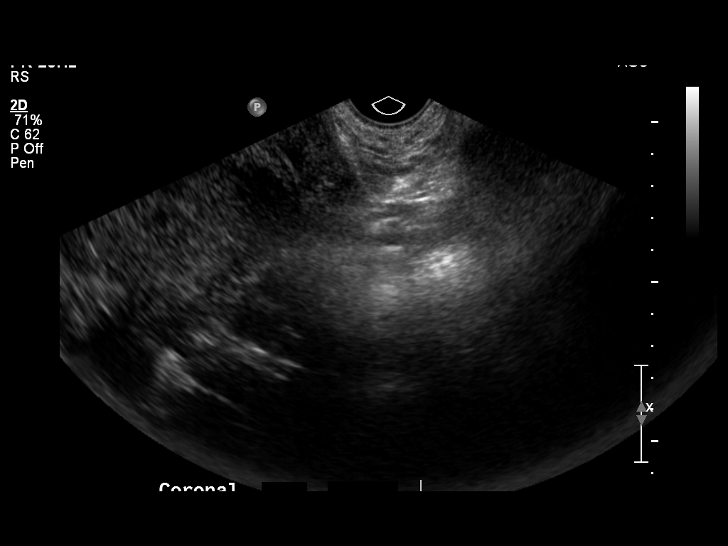

[13 of 25 positions shown; findings below may reference images not displayed]

FINDINGS: Uterus the uterus is 4-0.4 x 7.7 x 7.7 cm.  Lower uterine segment
fibroid measures 2.4 x 2.3 x 2.1 cm.  Fundal fibroid is 4.4 x 4.7 x
2.2 cm.

Endometrium Within the central aspect the uterus, there is a
collection of complex fluid in the endometrial canal measuring
cm.  Previously, this measured 2.4 x 1.5 x 1.9 cm.

Right Ovary right ovary is 3.4 x 2.2 x 3.0 cm.  A simple
follicle/cyst is 2.2 x 2.6 x 2.7 cm.  A para ovarian cyst is 1.9 x
1.0 x 1.4 cm.

Left Ovary left ovary is not visualized.

Other Findings:  No free pelvic fluid identified.  Bilateral
hydrosalpinges are not seen on today's exam.
IMPRESSION: 1.  Progressive collection of endometrial canal fluid, consistent
with myometrium.  Presence of the lower uterine segment fibroid
could be contributing to collection within the endometrial canal.
2.  Simple follicles / cyst in the right ovary.
3.  Bilateral known hydrosalpinges are not imaged today, likely
related to enlarged uterus and endovaginal technique used on
today's exam.

## 2011-09-23 LAB — URINALYSIS, ROUTINE W REFLEX MICROSCOPIC
Glucose, UA: NEGATIVE
Leukocytes, UA: NEGATIVE
Nitrite: NEGATIVE
Specific Gravity, Urine: 1.025
pH: 5.5

## 2011-09-23 LAB — URINE MICROSCOPIC-ADD ON

## 2011-09-23 LAB — GC/CHLAMYDIA PROBE AMP, GENITAL
Chlamydia, DNA Probe: NEGATIVE
GC Probe Amp, Genital: NEGATIVE

## 2011-09-23 LAB — WET PREP, GENITAL: Trich, Wet Prep: NONE SEEN

## 2013-03-18 ENCOUNTER — Other Ambulatory Visit (HOSPITAL_COMMUNITY): Payer: Self-pay | Admitting: Obstetrics

## 2013-03-18 DIAGNOSIS — Z1231 Encounter for screening mammogram for malignant neoplasm of breast: Secondary | ICD-10-CM

## 2013-04-01 ENCOUNTER — Ambulatory Visit (HOSPITAL_COMMUNITY): Payer: Medicaid Other | Attending: Obstetrics

## 2013-04-22 ENCOUNTER — Encounter (INDEPENDENT_AMBULATORY_CARE_PROVIDER_SITE_OTHER): Payer: Self-pay | Admitting: Surgery

## 2013-04-22 ENCOUNTER — Ambulatory Visit (INDEPENDENT_AMBULATORY_CARE_PROVIDER_SITE_OTHER): Payer: Medicaid Other | Admitting: Surgery

## 2013-04-22 VITALS — BP 132/84 | HR 80 | Temp 97.9°F | Resp 18 | Ht 62.0 in | Wt 266.4 lb

## 2013-04-22 DIAGNOSIS — K439 Ventral hernia without obstruction or gangrene: Secondary | ICD-10-CM

## 2013-04-22 NOTE — Patient Instructions (Signed)
Return after CT. 

## 2013-04-22 NOTE — Progress Notes (Signed)
Patient ID: Morgan [name], female   DOB: June 20, 1971, 42 y.o.   MRN: [id number]  Chief Complaint  Patient presents with  . New Evaluation    eval hernia    HPI Morgan [name] is a 42 y.o. female.  Patient sent at request of Dr. Shon Hough for abdominal wall hernia. She's had it for "many years". It does cause mild discomfort. It is getting larger. No constipation, nausea or vomiting. Does not smoke cigarettes. No redness or drainage. HPI  History reviewed. No pertinent past medical history.  Past Surgical History  Procedure Laterality Date  . Abdominal hysterectomy  02/04/2012    Partial    History reviewed. No pertinent family history.  Social History History  Substance Use Topics  . Smoking status: Never Smoker   . Smokeless tobacco: Never Used  . Alcohol Use: Yes     Comment: Social.    No Known Allergies  No current outpatient prescriptions on file.   No current facility-administered medications for this visit.    Review of Systems Review of Systems  Constitutional: Negative for fever, chills and unexpected weight change.  HENT: Negative for hearing loss, congestion, sore throat, trouble swallowing and voice change.   Eyes: Negative for visual disturbance.  Respiratory: Negative for cough and wheezing.   Cardiovascular: Negative for chest pain, palpitations and leg swelling.  Gastrointestinal: Negative for nausea, vomiting, abdominal pain, diarrhea, constipation, blood in stool, abdominal distention and anal bleeding.  Genitourinary: Negative for hematuria, vaginal bleeding and difficulty urinating.  Musculoskeletal: Negative for arthralgias.  Skin: Negative for rash and wound.  Neurological: Negative for seizures, syncope and headaches.  Hematological: Negative for adenopathy. Does not bruise/bleed easily.  Psychiatric/Behavioral: Negative for confusion.    Blood pressure 132/84, pulse 80, temperature 97.9 F (36.6 C), temperature source Temporal, resp. rate  18, height 5\' 2"  (1.575 m), weight 266 lb 6.4 oz (120.838 kg).  Physical Exam Physical Exam  Constitutional: She is oriented to person, place, and time. She appears well-developed and well-nourished.  HENT:  Head: Normocephalic and atraumatic.  Eyes: EOM are normal. Pupils are equal, round, and reactive to light.  Neck: Normal range of motion. Neck supple.  Cardiovascular: Normal rate and regular rhythm.   Pulmonary/Chest: Effort normal and breath sounds normal.  Abdominal: Soft. There is no tenderness.    Musculoskeletal: Normal range of motion.  Neurological: She is alert and oriented to person, place, and time.  Skin: Skin is warm and dry.  Psychiatric: She has a normal mood and affect. Her behavior is normal. Thought content normal.      Assessment    Ventral hernia    Plan    Recommend CT scan to further evaluate to see if this is amendable to laparoscopic repair. Return to clinic after evaluation to decide if laparoscopic or open is more appropriate.       Random Dobrowski A. 04/22/2013, 11:48 AM

## 2013-04-25 ENCOUNTER — Ambulatory Visit
Admission: RE | Admit: 2013-04-25 | Discharge: 2013-04-25 | Disposition: A | Discharge status: Home / Self Care | Payer: Medicaid Other | Source: Ambulatory Visit | Attending: Surgery | Admitting: Surgery

## 2013-04-25 DIAGNOSIS — K439 Ventral hernia without obstruction or gangrene: Secondary | ICD-10-CM

## 2013-04-25 MED ORDER — IOHEXOL 300 MG/ML  SOLN
125.0000 mL | Freq: Once | INTRAMUSCULAR | Status: AC | PRN
  Administered 2013-04-25: 125 mL via INTRAVENOUS

## 2013-05-13 ENCOUNTER — Encounter (INDEPENDENT_AMBULATORY_CARE_PROVIDER_SITE_OTHER): Payer: Self-pay | Admitting: Surgery

## 2013-05-13 ENCOUNTER — Ambulatory Visit (INDEPENDENT_AMBULATORY_CARE_PROVIDER_SITE_OTHER): Payer: Medicaid Other | Admitting: Surgery

## 2013-05-13 VITALS — BP 130/84 | HR 95 | Temp 97.4°F | Resp 18 | Ht 62.0 in | Wt 264.2 lb

## 2013-05-13 DIAGNOSIS — K436 Other and unspecified ventral hernia with obstruction, without gangrene: Secondary | ICD-10-CM

## 2013-05-13 NOTE — Patient Instructions (Signed)
Hernia Repair with Laparoscope A hernia occurs when an internal organ pushes out through a weak spot in the belly (abdominal) wall muscles. Hernias most commonly occur in the groin and around the navel. Hernias can also occur through a cut by the surgeon (incision) after an abdominal operation. A hernia may be caused by:  Lifting heavy objects.  Prolonged coughing.  Straining to move your bowels. Hernias can often be pushed back into place (reduced). Most hernias tend to get worse over time. Problems occur when abdominal contents get stuck in the opening and the blood supply is blocked or impaired (incarcerated hernia). Because of these risks, you require surgery to repair the hernia. Your hernia will be repaired using a laparoscope. Laparoscopic surgery is a type of minimally invasive surgery. It does not involve making a typical surgical cut (incision) in the skin. A laparoscope is a telescope-like rod and lens system. It is usually connected to a video camera and a light source so your caregiver can clearly see the operative area. The instruments are inserted through  to  inch (5 mm or 10 mm) openings in the skin at specific locations. A working and viewing space is created by blowing a small amount of carbon dioxide gas into the abdominal cavity. The abdomen is essentially blown up like a balloon (insufflated). This elevates the abdominal wall above the internal organs like a dome. The carbon dioxide gas is common to the human body and can be absorbed by tissue and removed by the respiratory system. Once the repair is completed, the small incisions will be closed with either stitches (sutures) or staples (just like a paper stapler only this staple holds the skin together). LET YOUR CAREGIVERS KNOW ABOUT:  Allergies.  Medications taken including herbs, eye drops, over the counter medications, and creams.  Use of steroids (by mouth or creams).  Previous problems with anesthetics or  Novocaine.  Possibility of pregnancy, if this applies.  History of blood clots (thrombophlebitis).  History of bleeding or blood problems.  Previous surgery.  Other health problems. BEFORE THE PROCEDURE  Laparoscopy can be done either in a hospital or out-patient clinic. You may be given a mild sedative to help you relax before the procedure. Once in the operating room, you will be given a general anesthesia to make you sleep (unless you and your caregiver choose a different anesthetic).  AFTER THE PROCEDURE  After the procedure you will be watched in a recovery area. Depending on what type of hernia was repaired, you might be admitted to the hospital or you might go home the same day. With this procedure you may have less pain and scarring. This usually results in a quicker recovery and less risk of infection. HOME CARE INSTRUCTIONS   Bed rest is not required. You may continue your normal activities but avoid heavy lifting (more than 10 pounds) or straining.  Cough gently. If you are a smoker it is best to stop, as even the best hernia repair can break down with the continual strain of coughing.  Avoid driving until given the OK by your surgeon.  There are no dietary restrictions unless given otherwise.  TAKE ALL MEDICATIONS AS DIRECTED.  Only take over-the-counter or prescription medicines for pain, discomfort, or fever as directed by your caregiver. SEEK MEDICAL CARE IF:   There is increasing abdominal pain or pain in your incisions.  There is more bleeding from incisions, other than minimal spotting.  You feel light headed or faint.  You   develop an unexplained fever, chills, and/or an oral temperature above 102 F (38.9 C).  You have redness, swelling, or increasing pain in the wound.  Pus coming from wound.  A foul smell coming from the wound or dressings. SEEK IMMEDIATE MEDICAL CARE IF:   You develop a rash.  You have difficulty breathing.  You have any  allergic problems. MAKE SURE YOU:   Understand these instructions.  Will watch your condition.  Will get help right away if you are not doing well or get worse. Document Released: 12/12/2005 Document Revised: 03/05/2012 Document Reviewed: 11/11/2009 [employer] Patient Information 2013 [employer].  

## 2013-05-13 NOTE — Progress Notes (Signed)
Patient ID: Morgan [name], female   DOB: 11/17/1971, 41 y.o.   MRN: [id number]  Chief Complaint  Patient presents with  . Routine Post Op    discuss hernia sx post ct    HPI Camille L Brayfield is a 42 y.o. female.  Patient sent at request of Dr. Truesdale for abdominal wall hernia. She's had it for "many years". It does cause mild discomfort. It is getting larger. No constipation, nausea or vomiting. Does not smoke cigarettes. No redness or drainage. Patient returns after CT scanning. There is a 4 cm x 4 cm fascial defect with transverse colon it. This is compared to CT scan from 2011 and appears similar. HPI  History reviewed. No pertinent past medical history.  Past Surgical History  Procedure Laterality Date  . Abdominal hysterectomy  02/04/2012    Partial    History reviewed. No pertinent family history.  Social History History  Substance Use Topics  . Smoking status: Never Smoker   . Smokeless tobacco: Never Used  . Alcohol Use: Yes     Comment: Social.    No Known Allergies  No current outpatient prescriptions on file.   No current facility-administered medications for this visit.    Review of Systems Review of Systems  Constitutional: Negative for fever, chills and unexpected weight change.  HENT: Negative for hearing loss, congestion, sore throat, trouble swallowing and voice change.   Eyes: Negative for visual disturbance.  Respiratory: Negative for cough and wheezing.   Cardiovascular: Negative for chest pain, palpitations and leg swelling.  Gastrointestinal: Negative for nausea, vomiting, abdominal pain, diarrhea, constipation, blood in stool, abdominal distention and anal bleeding.  Genitourinary: Negative for hematuria, vaginal bleeding and difficulty urinating.  Musculoskeletal: Negative for arthralgias.  Skin: Negative for rash and wound.  Neurological: Negative for seizures, syncope and headaches.  Hematological: Negative for adenopathy. Does not  bruise/bleed easily.  Psychiatric/Behavioral: Negative for confusion.    Blood pressure 130/84, pulse 95, temperature 97.4 F (36.3 C), temperature source Temporal, resp. rate 18, height 5' 2" (1.575 m), weight 264 lb 3.2 oz (119.84 kg).  Physical Exam Physical Exam  Constitutional: She is oriented to person, place, and time. She appears well-developed and well-nourished.  HENT:  Head: Normocephalic and atraumatic.  Eyes: EOM are normal. Pupils are equal, round, and reactive to light.  Neck: Normal range of motion. Neck supple.  Cardiovascular: Normal rate and regular rhythm.   Pulmonary/Chest: Effort normal and breath sounds normal.  Abdominal: Soft. There is no tenderness.    Musculoskeletal: Normal range of motion.  Neurological: She is alert and oriented to person, place, and time.  Skin: Skin is warm and dry.  Psychiatric: She has a normal mood and affect. Her behavior is normal. Thought content normal.    Clinical Data: Ventral hernia.  CT ABDOMEN AND PELVIS WITH CONTRAST  Technique: Multidetector CT imaging of the abdomen and pelvis was  performed following the standard protocol during bolus  administration of intravenous contrast.  Contrast: 125mL OMNIPAQUE IOHEXOL 300 MG/ML SOLN  Comparison: 08/30/2010  Findings: Lung bases are clear. No effusions. Heart is normal  size.  There is a midline ventral hernia superior to the umbilicus. This  contains the mid transverse colon and is stable since 2011. No  evidence of bowel obstruction.  Liver, spleen, gallbladder, pancreas, adrenals and kidneys are  unremarkable. Aorta is normal caliber. Small bowel is  decompressed.  Uterus and left ovary unremarkable. Small low-density area in the  right ovary, likely   small cysts. This measures 2.6 cm. Urinary  bladder decompressed, grossly unremarkable.  No acute bony abnormality. No acute bony abnormality.  IMPRESSION:  Stable large ventral hernia above the umbilicus containing  the  transverse colon. No evidence of obstruction.  Original Report Authenticated By: Kevin Dover, M.D.   Assessment    Ventral hernia.   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 Risks and benefits discussed. She would like to proceed with laparoscopic repair.The risk of hernia repair include bleeding,  Infection,   Recurrence of the hernia,  Mesh use, chronic pain,  Organ injury,  Bowel injury,  Bladder injury,   nerve injury with numbness around the incision,  Death,  and worsening of preexisting  medical problems.  The alternatives to surgery have been discussed as well..  Long term expectations of both operative and non operative treatments have been discussed.   The patient agrees to proceed.       Avanell Banwart A. 05/13/2013, 9:37 AM    

## 2013-05-27 ENCOUNTER — Encounter (HOSPITAL_COMMUNITY): Payer: Self-pay | Admitting: Pharmacy Technician

## 2013-06-03 ENCOUNTER — Encounter (HOSPITAL_COMMUNITY)
Admission: RE | Admit: 2013-06-03 | Discharge: 2013-06-03 | Disposition: A | Discharge status: Home / Self Care | Payer: Medicaid Other | Source: Ambulatory Visit | Attending: Surgery | Admitting: Surgery

## 2013-06-05 ENCOUNTER — Encounter (HOSPITAL_COMMUNITY)
Admission: RE | Admit: 2013-06-05 | Discharge: 2013-06-05 | Disposition: A | Discharge status: Home / Self Care | Payer: Medicaid Other | Source: Ambulatory Visit | Attending: Surgery | Admitting: Surgery

## 2013-06-05 ENCOUNTER — Encounter (HOSPITAL_COMMUNITY): Payer: Self-pay

## 2013-06-05 LAB — COMPREHENSIVE METABOLIC PANEL
Alkaline Phosphatase: 45 U/L (ref 39–117)
BUN: 18 mg/dL (ref 6–23)
Chloride: 106 mEq/L (ref 96–112)
GFR calc Af Amer: >90 mL/min (ref >90)
Glucose, Bld: 91 mg/dL (ref 70–99)
Potassium: 5.3 mEq/L — ABNORMAL HIGH (ref 3.5–5.1)
Total Bilirubin: 0.3 mg/dL (ref 0.3–1.2)

## 2013-06-05 LAB — CBC WITH DIFFERENTIAL/PLATELET
Hemoglobin: 13.6 g/dL (ref 12.0–15.0)
Lymphs Abs: 3.4 10*3/uL (ref 0.7–4.0)
Monocytes Relative: 10 % (ref 3–12)
Neutro Abs: 4.3 10*3/uL (ref 1.7–7.7)
Neutrophils Relative %: 50 % (ref 43–77)
RBC: 4.14 MIL/uL (ref 3.87–5.11)

## 2013-06-05 LAB — SURGICAL PCR SCREEN: Staphylococcus aureus: NEGATIVE

## 2013-06-05 MED ORDER — DEXTROSE 5 % IV SOLN
3.0000 g | INTRAVENOUS | Status: AC
  Administered 2013-06-06: 2 g via INTRAVENOUS
  Filled 2013-06-05: qty 3000

## 2013-06-05 MED ORDER — CHLORHEXIDINE GLUCONATE 4 % EX LIQD: 1.0000 "application " | Freq: Once | CUTANEOUS | Status: DC

## 2013-06-05 NOTE — Pre-Procedure Instructions (Signed)
Morgan [name]  06/05/2013   Your procedure is scheduled on: Thursday, June 06, 2013  Report to [facility] Elevators 3rd Floor at 5:30 AM.  Call this number if you have problems the morning of surgery: [phone]   Remember:   Do not eat food or drink liquids after midnight.   Take these medicines the morning of surgery with A SIP OF WATER: NONE             Stop taking Aspirin, and herbal medications. Do not take any NSAIDs ie: Ibuprofen, Advil, Naproxen or any medication containing Aspirin.  Do not wear jewelry, make-up or nail polish.  Do not wear lotions, powders, or perfumes.   Do not shave 48 hours prior to surgery.  Do not bring valuables to the hospital.  [facility] is not responsible  for any belongings or valuables.  Contacts, dentures or bridgework may not be worn into surgery.  Leave suitcase in the car. After surgery it may be brought to your room.  For patients admitted to the hospital, checkout time is 11:00 AM the day of discharge.   Patients discharged the day of surgery will not be allowed to drive home.  Name and phone number of your driver:   Special Instructions: Shower using CHG 2 nights before surgery and the night before surgery.  If you shower the day of surgery use CHG.  Use special wash - you have one bottle of CHG for all showers.  You should use approximately 1/3 of the bottle for each shower.   Please read over the following fact sheets that you were given: Pain Booklet, Coughing and Deep Breathing, MRSA Information and Surgical Site Infection Prevention

## 2013-06-06 ENCOUNTER — Encounter (HOSPITAL_COMMUNITY): Payer: Self-pay | Admitting: Anesthesiology

## 2013-06-06 ENCOUNTER — Encounter (HOSPITAL_COMMUNITY)
Admission: RE | Disposition: A | Discharge status: Home / Self Care | Payer: Self-pay | Source: Ambulatory Visit | Attending: Surgery

## 2013-06-06 ENCOUNTER — Encounter (HOSPITAL_COMMUNITY): Payer: Self-pay | Admitting: *Deleted

## 2013-06-06 ENCOUNTER — Inpatient Hospital Stay (HOSPITAL_COMMUNITY)
Admission: RE | Admit: 2013-06-06 | Discharge: 2013-06-09 | DRG: 354 | Disposition: A | Discharge status: Home / Self Care | Payer: Medicaid Other | Source: Ambulatory Visit | Attending: Surgery | Admitting: Surgery

## 2013-06-06 ENCOUNTER — Ambulatory Visit (HOSPITAL_COMMUNITY): Payer: Medicaid Other | Admitting: Anesthesiology

## 2013-06-06 DIAGNOSIS — Z5331 Laparoscopic surgical procedure converted to open procedure: Secondary | ICD-10-CM

## 2013-06-06 DIAGNOSIS — E669 Obesity, unspecified: Secondary | ICD-10-CM | POA: Diagnosis present

## 2013-06-06 DIAGNOSIS — K436 Other and unspecified ventral hernia with obstruction, without gangrene: Secondary | ICD-10-CM

## 2013-06-06 DIAGNOSIS — Z9889 Other specified postprocedural states: Secondary | ICD-10-CM

## 2013-06-06 DIAGNOSIS — Z6841 Body Mass Index (BMI) 40.0 and over, adult: Secondary | ICD-10-CM

## 2013-06-06 HISTORY — PX: LAPAROSCOPIC INCISIONAL / UMBILICAL / VENTRAL HERNIA REPAIR: SUR789

## 2013-06-06 HISTORY — PX: HERNIA REPAIR: SHX51

## 2013-06-06 HISTORY — PX: VENTRAL HERNIA REPAIR: SHX424

## 2013-06-06 HISTORY — DX: Other specified health status: Z78.9

## 2013-06-06 LAB — CBC
HCT: 38.9 % (ref 36.0–46.0)
Hemoglobin: 13.2 g/dL (ref 12.0–15.0)
MCHC: 33.9 g/dL (ref 30.0–36.0)
RBC: 4.06 MIL/uL (ref 3.87–5.11)
WBC: 15.4 10*3/uL — ABNORMAL HIGH (ref 4.0–10.5)

## 2013-06-06 LAB — CREATININE, SERUM
Creatinine, Ser: 0.87 mg/dL (ref 0.50–1.10)
GFR calc Af Amer: >90 mL/min (ref >90)
GFR calc non Af Amer: 83 mL/min — ABNORMAL LOW (ref >90)

## 2013-06-06 SURGERY — REPAIR, HERNIA, VENTRAL, LAPAROSCOPIC
Anesthesia: General | Site: Abdomen | Wound class: Clean

## 2013-06-06 MED ORDER — BUPIVACAINE-EPINEPHRINE PF 0.25-1:200000 % IJ SOLN
INTRAMUSCULAR | Status: AC
  Filled 2013-06-06: qty 30

## 2013-06-06 MED ORDER — MEPERIDINE HCL 25 MG/ML IJ SOLN: 6.2500 mg | INTRAMUSCULAR | Status: DC | PRN

## 2013-06-06 MED ORDER — ONDANSETRON HCL 4 MG/2ML IJ SOLN
INTRAMUSCULAR | Status: DC | PRN
  Administered 2013-06-06: 4 mg via INTRAVENOUS

## 2013-06-06 MED ORDER — ONDANSETRON HCL 4 MG/2ML IJ SOLN
4.0000 mg | Freq: Four times a day (QID) | INTRAMUSCULAR | Status: DC | PRN
  Administered 2013-06-07 (×3): 4 mg via INTRAVENOUS
  Filled 2013-06-06 (×3): qty 2

## 2013-06-06 MED ORDER — OXYCODONE-ACETAMINOPHEN 5-325 MG PO TABS
1.0000 | ORAL_TABLET | ORAL | Status: DC | PRN
  Administered 2013-06-07 – 2013-06-09 (×9): 2 via ORAL
  Filled 2013-06-06 (×9): qty 2

## 2013-06-06 MED ORDER — HYDROMORPHONE HCL PF 1 MG/ML IJ SOLN
0.2500 mg | INTRAMUSCULAR | Status: DC | PRN
  Administered 2013-06-06 (×2): 0.5 mg via INTRAVENOUS

## 2013-06-06 MED ORDER — PROPOFOL 10 MG/ML IV BOLUS
INTRAVENOUS | Status: DC | PRN
  Administered 2013-06-06: 100 mg via INTRAVENOUS

## 2013-06-06 MED ORDER — WHITE PETROLATUM GEL
Status: AC
  Administered 2013-06-06: 0.2
  Filled 2013-06-06: qty 5

## 2013-06-06 MED ORDER — ENOXAPARIN SODIUM 40 MG/0.4ML ~~LOC~~ SOLN
40.0000 mg | SUBCUTANEOUS | Status: DC
  Administered 2013-06-07 – 2013-06-09 (×3): 40 mg via SUBCUTANEOUS
  Filled 2013-06-06 (×4): qty 0.4

## 2013-06-06 MED ORDER — VECURONIUM BROMIDE 10 MG IV SOLR
INTRAVENOUS | Status: DC | PRN
  Administered 2013-06-06: 1 mg via INTRAVENOUS
  Administered 2013-06-06: 2 mg via INTRAVENOUS
  Administered 2013-06-06: 1 mg via INTRAVENOUS
  Administered 2013-06-06: 2 mg via INTRAVENOUS

## 2013-06-06 MED ORDER — KCL IN DEXTROSE-NACL 20-5-0.9 MEQ/L-%-% IV SOLN
INTRAVENOUS | Status: DC
  Administered 2013-06-06: 16:00:00 via INTRAVENOUS
  Filled 2013-06-06 (×3): qty 1000

## 2013-06-06 MED ORDER — SODIUM CHLORIDE 0.9 % IR SOLN
Status: DC | PRN
  Administered 2013-06-06: 1000 mL

## 2013-06-06 MED ORDER — FENTANYL CITRATE 0.05 MG/ML IJ SOLN
INTRAMUSCULAR | Status: DC | PRN
  Administered 2013-06-06: 50 ug via INTRAVENOUS
  Administered 2013-06-06: 150 ug via INTRAVENOUS
  Administered 2013-06-06 (×4): 50 ug via INTRAVENOUS
  Administered 2013-06-06: 100 ug via INTRAVENOUS

## 2013-06-06 MED ORDER — LACTATED RINGERS IV SOLN
INTRAVENOUS | Status: DC | PRN
  Administered 2013-06-06 (×3): via INTRAVENOUS

## 2013-06-06 MED ORDER — HYDROMORPHONE HCL PF 1 MG/ML IJ SOLN
1.0000 mg | INTRAMUSCULAR | Status: DC | PRN
  Administered 2013-06-06 – 2013-06-07 (×4): 1 mg via INTRAVENOUS
  Filled 2013-06-06 (×5): qty 1

## 2013-06-06 MED ORDER — NEOSTIGMINE METHYLSULFATE 1 MG/ML IJ SOLN
INTRAMUSCULAR | Status: DC | PRN
  Administered 2013-06-06: 4 mg via INTRAVENOUS

## 2013-06-06 MED ORDER — CEFAZOLIN SODIUM 1-5 GM-% IV SOLN
1.0000 g | Freq: Three times a day (TID) | INTRAVENOUS | Status: AC
  Administered 2013-06-06: 1 g via INTRAVENOUS
  Filled 2013-06-06: qty 50

## 2013-06-06 MED ORDER — LIDOCAINE HCL (CARDIAC) 20 MG/ML IV SOLN
INTRAVENOUS | Status: DC | PRN
  Administered 2013-06-06: 100 mg via INTRAVENOUS

## 2013-06-06 MED ORDER — ACETAMINOPHEN 325 MG PO TABS: 650.0000 mg | ORAL_TABLET | ORAL | Status: DC | PRN

## 2013-06-06 MED ORDER — ROCURONIUM BROMIDE 100 MG/10ML IV SOLN
INTRAVENOUS | Status: DC | PRN
  Administered 2013-06-06: 50 mg via INTRAVENOUS

## 2013-06-06 MED ORDER — ARTIFICIAL TEARS OP OINT
TOPICAL_OINTMENT | OPHTHALMIC | Status: DC | PRN
  Administered 2013-06-06: 1 via OPHTHALMIC

## 2013-06-06 MED ORDER — GLYCOPYRROLATE 0.2 MG/ML IJ SOLN
INTRAMUSCULAR | Status: DC | PRN
  Administered 2013-06-06: .8 mg via INTRAVENOUS

## 2013-06-06 MED ORDER — ONDANSETRON HCL 4 MG PO TABS: 4.0000 mg | ORAL_TABLET | Freq: Four times a day (QID) | ORAL | Status: DC | PRN

## 2013-06-06 MED ORDER — MIDAZOLAM HCL 5 MG/5ML IJ SOLN
INTRAMUSCULAR | Status: DC | PRN
  Administered 2013-06-06: 2 mg via INTRAVENOUS

## 2013-06-06 MED ORDER — OXYCODONE HCL 5 MG PO TABS: 5.0000 mg | ORAL_TABLET | Freq: Once | ORAL | Status: DC | PRN

## 2013-06-06 MED ORDER — BUPIVACAINE-EPINEPHRINE 0.25% -1:200000 IJ SOLN
INTRAMUSCULAR | Status: DC | PRN
  Administered 2013-06-06: 8 mL

## 2013-06-06 MED ORDER — ONDANSETRON HCL 4 MG/2ML IJ SOLN: 4.0000 mg | Freq: Once | INTRAMUSCULAR | Status: DC | PRN

## 2013-06-06 MED ORDER — OXYCODONE HCL 5 MG/5ML PO SOLN
5.0000 mg | Freq: Once | ORAL | Status: DC | PRN
Start: 2013-06-06 — End: 2013-06-06

## 2013-06-06 MED ORDER — LABETALOL HCL 5 MG/ML IV SOLN
INTRAVENOUS | Status: DC | PRN
  Administered 2013-06-06: 2.5 mg via INTRAVENOUS

## 2013-06-06 MED ORDER — HYDROMORPHONE HCL PF 1 MG/ML IJ SOLN
INTRAMUSCULAR | Status: AC
  Filled 2013-06-06: qty 1

## 2013-06-06 SURGICAL SUPPLY — 56 items
BINDER ABD UNIV 10 28-50 (GAUZE/BANDAGES/DRESSINGS) ×1 IMPLANT
BINDER ABD UNIV 12 45-62 (WOUND CARE) ×1 IMPLANT
BINDER ABDOM UNIV 10 (GAUZE/BANDAGES/DRESSINGS) ×2
BINDER ABDOMINAL 46IN 62IN (WOUND CARE) ×2
BLADE SURG 10 STRL SS (BLADE) ×2 IMPLANT
BLADE SURG 11 STRL SS (BLADE) ×2 IMPLANT
CANISTER SUCTION 2500CC (MISCELLANEOUS) IMPLANT
CHLORAPREP W/TINT 26ML (MISCELLANEOUS) ×2 IMPLANT
CLOTH BEACON ORANGE TIMEOUT ST (SAFETY) ×2 IMPLANT
COVER SURGICAL LIGHT HANDLE (MISCELLANEOUS) ×2 IMPLANT
DERMABOND ADVANCED (GAUZE/BANDAGES/DRESSINGS) ×1
DERMABOND ADVANCED .7 DNX12 (GAUZE/BANDAGES/DRESSINGS) ×1 IMPLANT
DEVICE SECURE STRAP 25 ABSORB (INSTRUMENTS) ×6 IMPLANT
DEVICE TROCAR PUNCTURE CLOSURE (ENDOMECHANICALS) ×2 IMPLANT
DRAIN CHANNEL 19F RND (DRAIN) ×2 IMPLANT
DRAPE UTILITY 15X26 W/TAPE STR (DRAPE) ×4 IMPLANT
DRAPE WARM FLUID 44X44 (DRAPE) ×2 IMPLANT
ELECT CAUTERY BLADE 6.4 (BLADE) ×2 IMPLANT
ELECT REM PT RETURN 9FT ADLT (ELECTROSURGICAL) ×2
ELECTRODE REM PT RTRN 9FT ADLT (ELECTROSURGICAL) ×1 IMPLANT
GLOVE BIO SURGEON STRL SZ8 (GLOVE) ×2 IMPLANT
GLOVE BIOGEL PI IND STRL 6 (GLOVE) ×1 IMPLANT
GLOVE BIOGEL PI IND STRL 7.0 (GLOVE) ×3 IMPLANT
GLOVE BIOGEL PI IND STRL 8 (GLOVE) ×1 IMPLANT
GLOVE BIOGEL PI INDICATOR 6 (GLOVE) ×1
GLOVE BIOGEL PI INDICATOR 7.0 (GLOVE) ×3
GLOVE BIOGEL PI INDICATOR 8 (GLOVE) ×1
GLOVE SURG SS PI 6.5 STRL IVOR (GLOVE) ×2 IMPLANT
GOWN STRL NON-REIN LRG LVL3 (GOWN DISPOSABLE) ×8 IMPLANT
GOWN STRL REIN XL XLG (GOWN DISPOSABLE) ×2 IMPLANT
KIT BASIN OR (CUSTOM PROCEDURE TRAY) ×2 IMPLANT
KIT ROOM TURNOVER OR (KITS) ×2 IMPLANT
MARKER SKIN DUAL TIP RULER LAB (MISCELLANEOUS) ×2 IMPLANT
MESH PARIETEX 20X15 (Mesh General) ×2 IMPLANT
NEEDLE SPNL 22GX3.5 QUINCKE BK (NEEDLE) ×2 IMPLANT
NS IRRIG 1000ML POUR BTL (IV SOLUTION) ×2 IMPLANT
PAD ARMBOARD 7.5X6 YLW CONV (MISCELLANEOUS) ×2 IMPLANT
PENCIL BUTTON HOLSTER BLD 10FT (ELECTRODE) ×2 IMPLANT
SCALPEL HARMONIC ACE (MISCELLANEOUS) ×2 IMPLANT
SLEEVE ENDOPATH XCEL 5M (ENDOMECHANICALS) ×6 IMPLANT
SPONGE GAUZE 4X4 12PLY (GAUZE/BANDAGES/DRESSINGS) ×2 IMPLANT
SPONGE LAP 18X18 X RAY DECT (DISPOSABLE) ×2 IMPLANT
STAPLER VISISTAT 35W (STAPLE) ×2 IMPLANT
SUT ETHILON 2 0 FS 18 (SUTURE) ×2 IMPLANT
SUT MON AB 4-0 PC3 18 (SUTURE) ×4 IMPLANT
SUT NOVA NAB DX-16 0-1 5-0 T12 (SUTURE) ×8 IMPLANT
SUT VIC AB 2-0 CT1 27 (SUTURE) ×1
SUT VIC AB 2-0 CT1 TAPERPNT 27 (SUTURE) ×1 IMPLANT
TOWEL OR 17X24 6PK STRL BLUE (TOWEL DISPOSABLE) ×2 IMPLANT
TOWEL OR 17X26 10 PK STRL BLUE (TOWEL DISPOSABLE) ×2 IMPLANT
TRAY FOLEY CATH 14FR (SET/KITS/TRAYS/PACK) ×2 IMPLANT
TRAY LAPAROSCOPIC (CUSTOM PROCEDURE TRAY) ×2 IMPLANT
TROCAR XCEL NON-BLD 11X100MML (ENDOMECHANICALS) ×2 IMPLANT
TROCAR XCEL NON-BLD 5MMX100MML (ENDOMECHANICALS) ×2 IMPLANT
TUBE CONNECTING 12X1/4 (SUCTIONS) ×2 IMPLANT
YANKAUER SUCT BULB TIP NO VENT (SUCTIONS) ×2 IMPLANT

## 2013-06-06 NOTE — Anesthesia Postprocedure Evaluation (Signed)
Anesthesia Post Note  Patient: Morgan [name]  Procedure(s) Performed: Procedure(s) (LRB): LAPAROSCOPIC VENTRAL HERNIA (N/A)  Anesthesia type: general  Patient location: PACU  Post pain: Pain level controlled  Post assessment: Patient's Cardiovascular Status Stable  Last Vitals:  Filed Vitals:   06/06/13 1226  BP: 168/92  Pulse: 92  Temp: 36.7 C  Resp:     Post vital signs: Reviewed and stable  Level of consciousness: sedated  Complications: No apparent anesthesia complications

## 2013-06-06 NOTE — Brief Op Note (Signed)
06/06/2013  10:34 AM  PATIENT:  Morgan [name]  42 y.o. female  PRE-OPERATIVE DIAGNOSIS:  Ventral hernia incarcerated  POST-OPERATIVE DIAGNOSIS:  Ventral hernia incarcerated  PROCEDURE:  Procedure(s): LAPAROSCOPIC VENTRAL HERNIA (N/A) with mesh   SURGEON:  Surgeon(s) and Role:    * Rain Wilhide A. Rutha Melgoza, MD - Primary   ASSISTANTS: none   ANESTHESIA:   local and general  EBL:  Total I/O In: 2000 [I.V.:2000] Out: 225 [Urine:150; Blood:75]  BLOOD ADMINISTERED:none  DRAINS: 2 F jackson Pratt to hernia sac   LOCAL MEDICATIONS USED:  BUPIVICAINE   SPECIMEN:  No Specimen  DISPOSITION OF SPECIMEN:  N/A  COUNTS:  YES  TOURNIQUET:  * No tourniquets in log *  DICTATION: .Other Dictation: Dictation Number  [id number]  PLAN OF CARE: Admit to inpatient   PATIENT DISPOSITION:  PACU - hemodynamically stable.   Delay start of Pharmacological VTE agent (>24hrs) due to surgical blood loss or risk of bleeding: no

## 2013-06-06 NOTE — H&P (View-Only) (Signed)
Patient ID: Morgan [name], female   DOB: [birth year], 42 y.o.   MRN: [id number]  Chief Complaint  Patient presents with  . Routine Post Op    discuss hernia sx post ct    HPI Morgan [name] is a 42 y.o. female.  Patient sent at request of Dr. Shon Hough for abdominal wall hernia. She's had it for "many years". It does cause mild discomfort. It is getting larger. No constipation, nausea or vomiting. Does not smoke cigarettes. No redness or drainage. Patient returns after CT scanning. There is a 4 cm x 4 cm fascial defect with transverse colon it. This is compared to CT scan from 2011 and appears similar. HPI  History reviewed. No pertinent past medical history.  Past Surgical History  Procedure Laterality Date  . Abdominal hysterectomy  02/04/2012    Partial    History reviewed. No pertinent family history.  Social History History  Substance Use Topics  . Smoking status: Never Smoker   . Smokeless tobacco: Never Used  . Alcohol Use: Yes     Comment: Social.    No Known Allergies  No current outpatient prescriptions on file.   No current facility-administered medications for this visit.    Review of Systems Review of Systems  Constitutional: Negative for fever, chills and unexpected weight change.  HENT: Negative for hearing loss, congestion, sore throat, trouble swallowing and voice change.   Eyes: Negative for visual disturbance.  Respiratory: Negative for cough and wheezing.   Cardiovascular: Negative for chest pain, palpitations and leg swelling.  Gastrointestinal: Negative for nausea, vomiting, abdominal pain, diarrhea, constipation, blood in stool, abdominal distention and anal bleeding.  Genitourinary: Negative for hematuria, vaginal bleeding and difficulty urinating.  Musculoskeletal: Negative for arthralgias.  Skin: Negative for rash and wound.  Neurological: Negative for seizures, syncope and headaches.  Hematological: Negative for adenopathy. Does not  bruise/bleed easily.  Psychiatric/Behavioral: Negative for confusion.    Blood pressure 130/84, pulse 95, temperature 97.4 F (36.3 C), temperature source Temporal, resp. rate 18, height 5\' 2"  (1.575 m), weight 264 lb 3.2 oz (119.84 kg).  Physical Exam Physical Exam  Constitutional: She is oriented to person, place, and time. She appears well-developed and well-nourished.  HENT:  Head: Normocephalic and atraumatic.  Eyes: EOM are normal. Pupils are equal, round, and reactive to light.  Neck: Normal range of motion. Neck supple.  Cardiovascular: Normal rate and regular rhythm.   Pulmonary/Chest: Effort normal and breath sounds normal.  Abdominal: Soft. There is no tenderness.    Musculoskeletal: Normal range of motion.  Neurological: She is alert and oriented to person, place, and time.  Skin: Skin is warm and dry.  Psychiatric: She has a normal mood and affect. Her behavior is normal. Thought content normal.    Clinical Data: Ventral hernia.  CT ABDOMEN AND PELVIS WITH CONTRAST  Technique: Multidetector CT imaging of the abdomen and pelvis was  performed following the standard protocol during bolus  administration of intravenous contrast.  Contrast: OMNIPAQUE IOHEXOL 300 MG/ML SOLN  Comparison: 08/30/2010  Findings: Lung bases are clear. No effusions. Heart is normal  size.  There is a midline ventral hernia superior to the umbilicus. This  contains the mid transverse colon and is stable since 2011. No  evidence of bowel obstruction.  Liver, spleen, gallbladder, pancreas, adrenals and kidneys are  unremarkable. Aorta is normal caliber. Small bowel is  decompressed.  Uterus and left ovary unremarkable. Small low-density area in the  right ovary, likely  small cysts. This measures 2.6 cm. Urinary  bladder decompressed, grossly unremarkable.  No acute bony abnormality. No acute bony abnormality.  IMPRESSION:  Stable large ventral hernia above the umbilicus containing  the  transverse colon. No evidence of obstruction.  Original Report Authenticated By: Charlett Nose, M.D.   Assessment    Ventral hernia.   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 Risks and benefits discussed. She would like to proceed with laparoscopic repair.The risk of hernia repair include bleeding,  Infection,   Recurrence of the hernia,  Mesh use, chronic pain,  Organ injury,  Bowel injury,  Bladder injury,   nerve injury with numbness around the incision,  Death,  and worsening of preexisting  medical problems.  The alternatives to surgery have been discussed as well..  Long term expectations of both operative and non operative treatments have been discussed.   The patient agrees to proceed.       Jimmye Wisnieski A. 05/13/2013, 9:37 AM

## 2013-06-06 NOTE — Preoperative (Signed)
Beta Blockers   Reason not to administer Beta Blockers:Not Applicable 

## 2013-06-06 NOTE — Transfer of Care (Signed)
Immediate Anesthesia Transfer of Care Note  Patient: Morgan [name]  Procedure(s) Performed: Procedure(s): LAPAROSCOPIC VENTRAL HERNIA (N/A)  Patient Location: PACU  Anesthesia Type:General  Level of Consciousness: awake, alert  and oriented  Airway & Oxygen Therapy: Patient Spontanous Breathing and Patient connected to face mask oxygen  Post-op Assessment: Report given to PACU RN, Post -op Vital signs reviewed and stable and Patient moving all extremities X 4  Post vital signs: Reviewed and stable  Complications: No apparent anesthesia complications

## 2013-06-06 NOTE — Interval H&P Note (Signed)
History and Physical Interval Note:  06/06/2013 7:03 AM  Morgan [name]  has presented today for surgery, with the diagnosis of ventral hernia  The various methods of treatment have been discussed with the patient and family. After consideration of risks, benefits and other options for treatment, the patient has consented to  Procedure(s): LAPAROSCOPIC VENTRAL HERNIA (N/A) as a surgical intervention .  The patient's history has been reviewed, patient examined, no change in status, stable for surgery.  I have reviewed the patient's chart and labs.  Questions were answered to the patient's satisfaction.     Leotis Isham A.

## 2013-06-06 NOTE — Anesthesia Preprocedure Evaluation (Addendum)
Anesthesia Evaluation  Patient identified by MRN, date of birth, ID band Patient awake    Reviewed: Allergy & Precautions, H&P , NPO status , Patient's Chart, lab work & pertinent test results  Airway Mallampati: II TM Distance: >3 FB Neck ROM: full    Dental  (+) Chipped and Dental Advidsory Given   Pulmonary          Cardiovascular     Neuro/Psych    GI/Hepatic   Endo/Other  Morbid obesity  Renal/GU      Musculoskeletal   Abdominal   Peds  Hematology   Anesthesia Other Findings   Reproductive/Obstetrics                           Anesthesia Physical Anesthesia Plan  ASA: III  Anesthesia Plan: General   Post-op Pain Management:    Induction: Intravenous  Airway Management Planned: Oral ETT  Additional Equipment:   Intra-op Plan:   Post-operative Plan: Extubation in OR  Informed Consent: I have reviewed the patients History and Physical, chart, labs and discussed the procedure including the risks, benefits and alternatives for the proposed anesthesia with the patient or authorized representative who has indicated his/her understanding and acceptance.   Dental Advisory Given  Plan Discussed with: CRNA and Surgeon  Anesthesia Plan Comments:        Anesthesia Quick Evaluation

## 2013-06-06 NOTE — Anesthesia Procedure Notes (Signed)
Procedure Name: Intubation Date/Time: 06/06/2013 7:39 AM Performed by: Carmela Rima Pre-anesthesia Checklist: Patient identified, Timeout performed, Emergency Drugs available, Suction available and Patient being monitored Patient Re-evaluated:Patient Re-evaluated prior to inductionOxygen Delivery Method: Circle system utilized Preoxygenation: Pre-oxygenation with 100% oxygen Intubation Type: IV induction Ventilation: Mask ventilation without difficulty Laryngoscope Size: Mac and 3 Grade View: Grade III Tube type: Oral Tube size: 7.5 mm Number of attempts: 3 Placement Confirmation: positive ETCO2 and breath sounds checked- equal and bilateral Secured at: 23 cm Tube secured with: Tape Dental Injury: Teeth and Oropharynx as per pre-operative assessment  Difficulty Due To: Difficult Airway- due to anterior larynx

## 2013-06-07 LAB — CBC
MCH: 32.4 pg (ref 26.0–34.0)
MCHC: 33.5 g/dL (ref 30.0–36.0)
MCV: 96.7 fL (ref 78.0–100.0)
Platelets: 304 10*3/uL (ref 150–400)
RBC: 3.89 MIL/uL (ref 3.87–5.11)

## 2013-06-07 LAB — BASIC METABOLIC PANEL
CO2: 24 mEq/L (ref 19–32)
Calcium: 8.6 mg/dL (ref 8.4–10.5)
Creatinine, Ser: 0.73 mg/dL (ref 0.50–1.10)
GFR calc non Af Amer: >90 mL/min (ref >90)
Sodium: 136 mEq/L (ref 135–145)

## 2013-06-07 MED ORDER — SODIUM CHLORIDE 0.9 % IV BOLUS (SEPSIS)
500.0000 mL | Freq: Once | INTRAVENOUS | Status: AC
  Administered 2013-06-07: 500 mL via INTRAVENOUS

## 2013-06-07 MED ORDER — POLYETHYLENE GLYCOL 3350 17 G PO PACK
17.0000 g | PACK | Freq: Every day | ORAL | Status: DC
  Administered 2013-06-07: 17 g via ORAL
  Filled 2013-06-07 (×2): qty 1

## 2013-06-07 MED ORDER — KCL IN DEXTROSE-NACL 20-5-0.45 MEQ/L-%-% IV SOLN
INTRAVENOUS | Status: DC
  Administered 2013-06-07 – 2013-06-08 (×2): via INTRAVENOUS
  Filled 2013-06-07 (×5): qty 1000

## 2013-06-07 NOTE — Progress Notes (Signed)
Patient poor PO intake and decreased UOP prompted contact to [facility] MD on call.  Dr. Dwain Sarna requested Bladder scan and report to him the results. Prior to bladder scan Patient able to void 200 ml of dark amber urine and had 60 ml of residual urine on bladder scan. Made contact with Dr. Dwain Sarna to report these findings.   Jacqulyn Cane, RN

## 2013-06-07 NOTE — Op Note (Signed)
[name], [name] NO.:  [id number]  MEDICAL RECORD NO.:  [id number]  LOCATION:  6N23C                        FACILITY:  [facility]  PHYSICIAN:  Morgan [name], M.D.DATE OF BIRTH:  September 17, 1971  DATE OF PROCEDURE: DATE OF DISCHARGE:                              OPERATIVE REPORT   PREOPERATIVE DIAGNOSIS:  Incarcerated ventral hernia measuring approximately 5 cm.  POSTOPERATIVE DIAGNOSIS:  Incarcerated ventral hernia containing transverse colon measuring 5 x 4 cm.  PROCEDURE:  Laparoscopic repair of incarcerated ventral hernia using Parietex composite mesh.  SURGEON:  Morgan [name], M.D.  ANESTHESIA:  General endotracheal anesthesia with 0.25% Sensorcaine local with epinephrine.  ESTIMATED BLOOD LOSS:  Minimal.  SPECIMENS:  None.  INDICATIONS FOR PROCEDURE:  The patient is a 42 year old female with a history of longstanding of ventral hernia.  It was diagnosed a couple years ago on CT scanning that transverse colon was within this, but she has had no issues with constipation or obstruction.  This got large and more uncomfortable and she wished to have it repaired.  We discussed open techniques, laparoscopic techniques, combination of both.  Risks, benefits, and alternative therapies discussed at great length with the patient in the office.  These were outlined in my history and physical. I answered questions this morning to the best of my ability.  After discussion of all of the above, she wished to proceed with laparoscopic approach.  They understand that we may have to open depending on if we could reduce this incarcerated hernia sac or not and for other reasons as well.  DESCRIPTION OF PROCEDURE:  The patient had been in the holding area. Questions were answered.  We took her back to the operating room and placed her supine on the operating room table.  After induction of general anesthesia, the abdomen was prepped and draped in sterile fashion.   Foley catheter was placed under sterile conditions.  She received 3 g of Ancef.  Time-out was done.  I placed a 5 mm Optiview port and this was the left upper quadrant.  This was advanced carefully through the abdominal wall entering the abdominal cavity without difficulty.  The pneumoperitoneum was then created to 15 mmHg of CO2. Laparoscope was placed.  No evidence of bowel injury with insertion of this port.  Her transverse colon incarcerated and hernia defect looked about 45 cm.  I placed 3 other 5 mm ports, 1 in the patient's right lower quadrant, and then 1 just below the umbilicus.  Attempts were made to reduce this laparoscopically, but it was quite tethered.  This has been incarcerated for least 2 years and was quite stuck.  We tried to manipulate it carefully, but my concern was of injuring the colon. Therefore, I  thought that an incision would be appropriate to go down and help reduce the contents of the hernia sac.  Approximately, 5 cm incision was made over the hernia sac which was easily palpable.  We dissected down to the skin and subcutaneous tissues and then found the hernia sac.  I opened this.  There was omentum and transverse colon incarcerated, but it was not strangulated.  I had to make the  fascia opening larger to reduce a large amount of omentum stuck up in this hernia sac as well as transverse colon.  We extended the fascial defect up about 2 cm.  This helped [place] reduce all contents back into the abdominal cavity without difficulty.  Hemostasis was achieved.  I then closed the fascia with interrupted #1 Novafil sutures.  Prior to closing up all the way, I selected a 15x20 cm  piece of Pareitex  composite mesh.  It was oriented to cover the defect with at least 4 cm of overlap.  We placed 4 anchoring sutures.  These were rolled up after wetting it and put into the abdominal cavity.  Once we closed the fascia, I re-insufflated the abdominal cavity and unfurled the  mesh.  I was able to pull all 4 sutures up to cover the defect at least 3 or 4 cm of overlap, especially after closure.  The sutures were tied down.  A secure strap absorbable tacker was used to tack the mesh circumferentially.  I placed an additional suture inferiorly through an additional 5 mm port site.  Once this was done, we examined and saw the mesh had a good overall coverage with no pleating.  I reexamined all cavity and saw no evidence of bowel injury.  All 4 quadrants were examined carefully.   I saw no evidence of succus or stool.  The gallbladder and liver were normal.  Stomach normal.  Remainder of the abdominal viscera appeared without injury or abnormality.  At this point in time, we removed our ports allowing the CO2 to escape.  The 5 cm wound was drained with a 19-French Jackson-Pratt drain brought out through a separate stab incision.  We closed with deep layer of 3-0 Vicryl and staples to close all skin incisions.  Final counts were found to be correct of sponge, needle, and instruments.  The patient was then awoke, extubated, taken to recovery in satisfactory condition.     River Ambrosio A. Neema [name], M.D.     TAC/MEDQ  D:  06/06/2013  T:  06/07/2013  Job:  [id number]

## 2013-06-07 NOTE — Progress Notes (Signed)
Made contact with Dr. Dwain Sarna to inform him of BP of 178/87.  Also reported that Pt diastolic BP has been greater than 91 mmHg past 4 readings.   Jacqulyn Cane, RN

## 2013-06-07 NOTE — Progress Notes (Signed)
1 Day Post-Op  Subjective: VERY SORE  Objective: Vital signs in last 24 hours: Temp:  [97.9 F (36.6 C)-99.3 F (37.4 C)] 99.2 F (37.3 C) (06/13 1610) Pulse Rate:  [78-103] 81 (06/13 0608) Resp:  [18-25] 19 (06/13 0608) BP: (151-186)/(76-103) 158/91 mmHg (06/13 0608) SpO2:  [92 %-99 %] 97 % (06/13 0608) Weight:  [264 lb 5.3 oz (119.9 kg)] 264 lb 5.3 oz (119.9 kg) (06/13 0700)    Intake/Output from previous day: 06/12 0701 - 06/13 0700 In: 3783.8 [P.O.:240; I.V.:3493.8; IV Piggyback:50] Out: 1740 [Urine:1650; Drains:15; Blood:75] Intake/Output this shift: Total I/O In: 225 [I.V.:225] Out: 0   Incision/Wound:DRESSINGS DRY.  JP  SEROUS. SORE DIFFUSELY  Lab Results:   Recent Labs  06/06/13 1345 06/07/13 0540  WBC 15.4* 11.9*  HGB 13.2 12.6  HCT 38.9 37.6  PLT 303 304   BMET  Recent Labs  06/05/13 1400 06/06/13 1345 06/07/13 0540  NA 139  --  136  K 5.3*  --  3.6  CL 106  --  104  CO2 28  --  24  GLUCOSE 91  --  123*  BUN 18  --  8  CREATININE 0.85 0.87 0.73  CALCIUM 9.8  --  8.6   PT/INR No results found for this basename: LABPROT, INR,  in the last 72 hours ABG No results found for this basename: PHART, PCO2, PO2, HCO3,  in the last 72 hours  Studies/Results: No results found.  Anti-infectives: Anti-infectives   Start     Dose/Rate Route Frequency Ordered Stop   06/06/13 1600  ceFAZolin (ANCEF) IVPB 1 g/50 mL premix     1 g 100 mL/hr over 30 Minutes Intravenous 3 times per day 06/06/13 1259 06/06/13 1615   06/06/13 0600  ceFAZolin (ANCEF) 3 g in dextrose 5 % 50 mL IVPB     3 g 160 mL/hr over 30 Minutes Intravenous On call to O.R. 06/05/13 1437 06/06/13 0740      Assessment/Plan: s/p Procedure(s): LAPAROSCOPIC VENTRAL HERNIA (N/A) Advance diet Plan for discharge tomorrow  LOS: 1 day    Morgan [name] A. 06/07/2013

## 2013-06-08 MED ORDER — POLYETHYLENE GLYCOL 3350 17 G PO PACK
17.0000 g | PACK | Freq: Two times a day (BID) | ORAL | Status: DC
  Administered 2013-06-08 – 2013-06-09 (×3): 17 g via ORAL
  Filled 2013-06-08 (×3): qty 1

## 2013-06-08 NOTE — Progress Notes (Signed)
2 Days Post-Op  Subjective: Not much flatus. Poor intake.   Sore.  Objective: Vital signs in last 24 hours: Temp:  [98.3 F (36.8 C)-99.3 F (37.4 C)] 98.3 F (36.8 C) (06/14 0510) Pulse Rate:  [75-96] 96 (06/14 0510) Resp:  [18-20] 20 (06/14 0510) BP: (143-178)/(85-95) 143/85 mmHg (06/14 0510) SpO2:  [92 %-95 %] 92 % (06/14 0510) Last BM Date: 06/05/13  Intake/Output from previous day: 06/13 0701 - 06/14 0700 In: 1567.5 [P.O.:480; I.V.:1087.5] Out: 1175 [Urine:1150; Drains:25] Intake/Output this shift:    Incision/Wound:clean dry intact. JP serous. ND  Lab Results:   Recent Labs  06/06/13 1345 06/07/13 0540  WBC 15.4* 11.9*  HGB 13.2 12.6  HCT 38.9 37.6  PLT 303 304   BMET  Recent Labs  06/05/13 1400 06/06/13 1345 06/07/13 0540  NA 139  --  136  K 5.3*  --  3.6  CL 106  --  104  CO2 28  --  24  GLUCOSE 91  --  123*  BUN 18  --  8  CREATININE 0.85 0.87 0.73  CALCIUM 9.8  --  8.6   PT/INR No results found for this basename: LABPROT, INR,  in the last 72 hours ABG No results found for this basename: PHART, PCO2, PO2, HCO3,  in the last 72 hours  Studies/Results: No results found.  Anti-infectives: Anti-infectives   Start     Dose/Rate Route Frequency Ordered Stop   06/06/13 1600  ceFAZolin (ANCEF) IVPB 1 g/50 mL premix     1 g 100 mL/hr over 30 Minutes Intravenous 3 times per day 06/06/13 1259 06/06/13 1615   06/06/13 0600  ceFAZolin (ANCEF) 3 g in dextrose 5 % 50 mL IVPB     3 g 160 mL/hr over 30 Minutes Intravenous On call to O.R. 06/05/13 1437 06/06/13 0740      Assessment/Plan: s/p Procedure(s): LAPAROSCOPIC VENTRAL HERNIA (N/A) PO intake not good.  Encourage PO intake and ambulate moore.  Not ready for discharge yet.  Increase Miralax.   LOS: 2 days    Hildegard Hlavac A. 06/08/2013

## 2013-06-09 MED ORDER — OXYCODONE-ACETAMINOPHEN 5-325 MG PO TABS: 1.0000 | ORAL_TABLET | ORAL | Status: DC | PRN

## 2013-06-09 MED ORDER — BISACODYL 10 MG RE SUPP
10.0000 mg | Freq: Once | RECTAL | Status: AC
  Administered 2013-06-09: 10 mg via RECTAL
  Filled 2013-06-09: qty 1

## 2013-06-09 MED ORDER — POLYETHYLENE GLYCOL 3350 17 G PO PACK: 17.0000 g | PACK | Freq: Two times a day (BID) | ORAL | Status: DC

## 2013-06-09 NOTE — Progress Notes (Signed)
3 Days Post-Op  Subjective: Passing gas.  [place].  No BM BUT PREPPED.  Objective: Vital signs in last 24 hours: Temp:  [97.9 F (36.6 C)-98.6 F (37 C)] 97.9 F (36.6 C) (06/15 0549) Pulse Rate:  [82-95] 93 (06/15 0549) Resp:  [18-20] 18 (06/15 0549) BP: (145-159)/(74-98) 149/74 mmHg (06/15 0549) SpO2:  [92 %-97 %] 97 % (06/15 0549) Last BM Date: 06/06/13  Intake/Output from previous day: 06/14 0701 - 06/15 0700 In: 240 [P.O.:240] Out: 1710 [Urine:1700; Drains:10] Intake/Output this shift:    Incision/Wound:CLEAN DRY INTACT JP SEROUS. OBESE BUT SOFT  Lab Results:   Recent Labs  06/06/13 1345 06/07/13 0540  WBC 15.4* 11.9*  HGB 13.2 12.6  HCT 38.9 37.6  PLT 303 304   BMET  Recent Labs  06/06/13 1345 06/07/13 0540  NA  --  136  K  --  3.6  CL  --  104  CO2  --  24  GLUCOSE  --  123*  BUN  --  8  CREATININE 0.87 0.73  CALCIUM  --  8.6   PT/INR No results found for this basename: LABPROT, INR,  in the last 72 hours ABG No results found for this basename: PHART, PCO2, PO2, HCO3,  in the last 72 hours  Studies/Results: No results found.  Anti-infectives: Anti-infectives   Start     Dose/Rate Route Frequency Ordered Stop   06/06/13 1600  ceFAZolin (ANCEF) IVPB 1 g/50 mL premix     1 g 100 mL/hr over 30 Minutes Intravenous 3 times per day 06/06/13 1259 06/06/13 1615   06/06/13 0600  ceFAZolin (ANCEF) 3 g in dextrose 5 % 50 mL IVPB     3 g 160 mL/hr over 30 Minutes Intravenous On call to O.R. 06/05/13 1437 06/06/13 0740      Assessment/Plan: s/p Procedure(s): LAPAROSCOPIC VENTRAL HERNIA (N/A) Adv diet Home after lunch if she tolerates regular diet Dulcolax this am.  LOS: 3 days    Othell Diluzio A. 06/09/2013

## 2013-06-09 NOTE — Discharge Summary (Signed)
Physician Discharge Summary  Patient ID: Morgan [name] MRN: [id number] DOB/AGE: Apr 26, 1971 42 y.o.  Admit date: 06/06/2013 Discharge date: 06/09/2013  Admission Diagnoses: VENTRAL HERNIA There are no active problems to display for this patient. OBESITY  Discharge Diagnoses: SAME Active Problems:   * No active hospital problems. *   Discharged Condition: good  Hospital Course: Pain improved.  PO intake better and diet advanced on day 3.  Wounds look good and JP drain serosanguinous. Soft abdomen and passing flatus.    Consults: None  Significant Diagnostic Studies: none  Treatments: laparoscopic ventral hernia repair  Discharge Exam: Blood pressure 149/74, pulse 93, temperature 97.9 F (36.6 C), temperature source Oral, resp. rate 18, height 5\' 2"  (1.575 m), weight 264 lb 5.3 oz (119.9 kg), SpO2 97.00%. Incision/Wound:C/D/I JP serous.  Soft abdomen appropriately sore.   Disposition: 01-Home or Self Care  Discharge Orders   Future Appointments Provider Department Dept Phone   06/24/2013 10:00 AM Maisie Fus A. Sammie Schermerhorn, MD [facility], [place] [phone]   Future Orders Complete By Expires     Diet - low sodium heart healthy  As directed     Discharge instructions  As directed     Comments:      RETURN Friday TO REMOVE STAPLES AND DRAIN. OK TO SHOWER AND GET STAPLES WET.  DO NOT RECOVER INCISIONS    Driving Restrictions  As directed     Comments:      NO DRIVING FOR 2 WEEKS    Increase activity slowly  As directed     Lifting restrictions  As directed     Comments:      NO LIFTING FOR 2 WEEKS.        Medication List    TAKE these medications       oxyCODONE-acetaminophen 5-325 MG per tablet  Commonly known as:  PERCOCET/ROXICET  Take 1-2 tablets by mouth every 4 (four) hours as needed.     polyethylene glycol packet  Commonly known as:  MIRALAX / GLYCOLAX  Take 17 g by mouth 2 (two) times daily.         Signed: Shaletta Hinostroza A. 06/09/2013, 9:08  AM

## 2013-06-09 NOTE — Progress Notes (Signed)
Pt discharged to home accomp by friend.  Rx given for pain med and explained.  FU appt reviewed.  Pt given JP bulb holder, measuring container and chart to take to MD at FU appt with record of amount of drainage from JP drain.  Pt able to demonstrate how to empty JP drain.  Abd binder given to pt so she will have 2 at home.  Reviewed all discharge instructions with pt and copy given.

## 2013-06-10 ENCOUNTER — Telehealth (INDEPENDENT_AMBULATORY_CARE_PROVIDER_SITE_OTHER): Payer: Self-pay

## 2013-06-10 NOTE — Telephone Encounter (Signed)
LMOV pt to schedule nurse only visit for 06/14/13 for staple removal per Dr. Luisa Hart.

## 2013-06-11 ENCOUNTER — Encounter (HOSPITAL_COMMUNITY): Payer: Self-pay | Admitting: Surgery

## 2013-06-14 ENCOUNTER — Other Ambulatory Visit (INDEPENDENT_AMBULATORY_CARE_PROVIDER_SITE_OTHER): Payer: Self-pay

## 2013-06-14 ENCOUNTER — Ambulatory Visit (INDEPENDENT_AMBULATORY_CARE_PROVIDER_SITE_OTHER): Payer: Medicaid Other

## 2013-06-14 VITALS — BP 132/80 | HR 70 | Temp 98.9°F | Resp 18 | Ht 60.0 in | Wt 257.0 lb

## 2013-06-14 DIAGNOSIS — Z4803 Encounter for change or removal of drains: Secondary | ICD-10-CM

## 2013-06-14 DIAGNOSIS — Z4889 Encounter for other specified surgical aftercare: Secondary | ICD-10-CM

## 2013-06-14 DIAGNOSIS — R109 Unspecified abdominal pain: Secondary | ICD-10-CM

## 2013-06-14 MED ORDER — HYDROCODONE-ACETAMINOPHEN 5-325 MG PO TABS: 1.0000 | ORAL_TABLET | Freq: Four times a day (QID) | ORAL | Status: DC | PRN

## 2013-06-14 NOTE — Progress Notes (Signed)
Nurse only visit ; abdomen  Incision intact ;soreness,drainage noted; cleansed incision site  with chola prep; removed 25 staples; applied steri-strip adhesive to abdominal incision ; applied steri strips; Advised steri strips will fall off do not remove    Patient tolerated well  Jp drain with 10cc clear fluid noted ; patient reports 10cc x3 days ; Cleansed drain site; removed sutures ; removed JP drian. patient tolerated well. Advised to call if temp 100.3 /greater, serous drainage,odor, swelling at site. Patient verbalized understanding. She is to return on 06-24-13 Dr. Luisa Hart. Patient requested pain medication. She has not had a refill since surgery. Norco 5/325mg  one po q4-6hrs/prnpain #30 per standing orders . Phone to [facility]

## 2013-06-24 ENCOUNTER — Ambulatory Visit (INDEPENDENT_AMBULATORY_CARE_PROVIDER_SITE_OTHER): Payer: Medicaid Other | Admitting: Surgery

## 2013-06-24 ENCOUNTER — Encounter (INDEPENDENT_AMBULATORY_CARE_PROVIDER_SITE_OTHER): Payer: Self-pay | Admitting: Surgery

## 2013-06-24 VITALS — BP 128/76 | HR 82 | Resp 14 | Ht 62.0 in | Wt 255.2 lb

## 2013-06-24 DIAGNOSIS — Z9889 Other specified postprocedural states: Secondary | ICD-10-CM

## 2013-06-24 NOTE — Progress Notes (Signed)
Patient returns after laparoscopic repair of ventral hernia. She has no pain. She is having issues moving her bowels. She stopped taking her MiraLAX. She's bloated. No nausea vomiting. No fever chills.  Exam: Incisions clean dry and intact. Drain is removed. No signs of hernia recurrence. Soft nontender.  Impression: Status post laparoscopic ventral hernia. Mesh  Plan: Recommend small meals. Restart MiraLAX. No lifting. Return one month.

## 2013-06-24 NOTE — Patient Instructions (Signed)
No lifting.  Miralax twice a day. Return 1 month.

## 2013-06-26 ENCOUNTER — Emergency Department (HOSPITAL_COMMUNITY)
Admission: EM | Admit: 2013-06-26 | Discharge: 2013-06-27 | Disposition: A | Discharge status: Home / Self Care | Payer: Medicaid Other | Attending: Emergency Medicine | Admitting: Emergency Medicine

## 2013-06-26 ENCOUNTER — Emergency Department (HOSPITAL_COMMUNITY): Payer: Medicaid Other

## 2013-06-26 ENCOUNTER — Encounter (HOSPITAL_COMMUNITY): Payer: Self-pay | Admitting: Emergency Medicine

## 2013-06-26 DIAGNOSIS — Z90710 Acquired absence of both cervix and uterus: Secondary | ICD-10-CM | POA: Insufficient documentation

## 2013-06-26 DIAGNOSIS — Z9889 Other specified postprocedural states: Secondary | ICD-10-CM | POA: Insufficient documentation

## 2013-06-26 DIAGNOSIS — R112 Nausea with vomiting, unspecified: Secondary | ICD-10-CM | POA: Insufficient documentation

## 2013-06-26 DIAGNOSIS — R197 Diarrhea, unspecified: Secondary | ICD-10-CM | POA: Insufficient documentation

## 2013-06-26 LAB — CBC WITH DIFFERENTIAL/PLATELET
Basophils Absolute: 0 10*3/uL (ref 0.0–0.1)
Eosinophils Absolute: 0 10*3/uL (ref 0.0–0.7)
Eosinophils Relative: 0 % (ref 0–5)
Lymphocytes Relative: 13 % (ref 12–46)
MCH: 33 pg (ref 26.0–34.0)
MCV: 93 fL (ref 78.0–100.0)
Neutrophils Relative %: 83 % — ABNORMAL HIGH (ref 43–77)
Platelets: 452 10*3/uL — ABNORMAL HIGH (ref 150–400)
RBC: 4 MIL/uL (ref 3.87–5.11)
RDW: 13 % (ref 11.5–15.5)
WBC: 10.8 10*3/uL — ABNORMAL HIGH (ref 4.0–10.5)

## 2013-06-26 LAB — COMPREHENSIVE METABOLIC PANEL
ALT: 16 U/L (ref 0–35)
AST: 15 U/L (ref 0–37)
Albumin: 3.5 g/dL (ref 3.5–5.2)
Alkaline Phosphatase: 59 U/L (ref 39–117)
Calcium: 9.7 mg/dL (ref 8.4–10.5)
Potassium: 4.3 mEq/L (ref 3.5–5.1)
Sodium: 137 mEq/L (ref 135–145)
Total Protein: 8.6 g/dL — ABNORMAL HIGH (ref 6.0–8.3)

## 2013-06-26 MED ORDER — ONDANSETRON 4 MG PO TBDP: 8.0000 mg | ORAL_TABLET | Freq: Once | ORAL | Status: DC

## 2013-06-26 MED ORDER — METOCLOPRAMIDE HCL 5 MG/ML IJ SOLN
10.0000 mg | Freq: Once | INTRAMUSCULAR | Status: AC
  Administered 2013-06-26: 10 mg via INTRAVENOUS
  Filled 2013-06-26: qty 2

## 2013-06-26 MED ORDER — FENTANYL CITRATE 0.05 MG/ML IJ SOLN
50.0000 ug | Freq: Once | INTRAMUSCULAR | Status: AC
  Administered 2013-06-26: 50 ug via INTRAVENOUS
  Filled 2013-06-26: qty 2

## 2013-06-26 NOTE — ED Provider Notes (Signed)
History    CSN: [id number] Arrival date & time 06/26/13  2014  First MD Initiated Contact with Patient 06/26/13 2258     Chief Complaint  Patient presents with  . Emesis   (Consider location/radiation/quality/duration/timing/severity/associated sxs/prior Treatment) HPI 42 yo female presents to the ER from home with complaint of n/v/d starting this morning after eating steak and spinach.  She estimates she has vomited more than 20 times.  No sick contacts.  No urinary symptoms.  Pt is s/p hysterectomy.  Pt is 3 weeks post op from ventral hernia repair.  No pain medications in 2 days.  Has not been taking miralax.  No fevers, chills.    Past Medical History  Diagnosis Date  . Medical history non-contributory    Past Surgical History  Procedure Laterality Date  . Hernia repair  06/06/2013  . Laparoscopic incisional / umbilical / ventral hernia repair  06/06/2013    VHR  . Abdominal hysterectomy  01/2011    Partial  . Dilation and curettage of uterus  2012  . Ventral hernia repair N/A 06/06/2013    Procedure: LAPAROSCOPIC VENTRAL HERNIA;  Surgeon: Clovis Pu. Cornett, MD;  Location: [facility] OR;  Service: General;  Laterality: N/A;   No family history on file. History  Substance Use Topics  . Smoking status: Never Smoker   . Smokeless tobacco: Never Used  . Alcohol Use: Yes     Comment: 06/06/2013 "maybe a beer once or twice/month"   OB History   Grav Para Term Preterm Abortions TAB SAB Ect Mult Living                 Review of Systems  All other systems reviewed and are negative.    Allergies  Review of patient's allergies indicates no known allergies.  Home Medications   Current Outpatient Rx  Name  Route  Sig  Dispense  Refill  . HYDROcodone-acetaminophen (NORCO/VICODIN) 5-325 MG per tablet   Oral   Take 1 tablet by mouth every 6 (six) hours as needed for pain.         . polyethylene glycol (MIRALAX / GLYCOLAX) packet   Oral   Take 17 g by mouth 2 (two) times  daily.          BP 147/92  Pulse 65  Temp(Src) 98.3 F (36.8 C) (Oral)  Resp 14  SpO2 100% Physical Exam  Nursing note and vitals reviewed. Constitutional: She appears distressed.  Obese female, uncomfortable appearing  HENT:  Head: Normocephalic and atraumatic.  Nose: Nose normal.  Mouth/Throat: Oropharynx is clear and moist.  Eyes: Conjunctivae and EOM are normal. Pupils are equal, round, and reactive to light.  Neck: Normal range of motion. Neck supple. No JVD present. No tracheal deviation present. No thyromegaly present.  Cardiovascular: Normal rate, regular rhythm, normal heart sounds and intact distal pulses.  Exam reveals no gallop and no friction rub.   No murmur heard. Pulmonary/Chest: Effort normal and breath sounds normal. No stridor. No respiratory distress. She has no wheezes. She has no rales. She exhibits no tenderness.  Abdominal: Soft. She exhibits no distension and no mass. There is tenderness (mild diffuse). There is no rebound and no guarding.  Hyperactive bowel sounds.  Surgical incisions with steristrips, no erythema or induration  Lymphadenopathy:    She has no cervical adenopathy.  Skin: Skin is warm and dry. No rash noted. She is not diaphoretic. No erythema. No pallor.    ED Course  Procedures (including critical  care time) Labs Reviewed  CBC WITH DIFFERENTIAL - Abnormal; Notable for the following:    WBC 10.8 (*)    Platelets 452 (*)    Neutrophils Relative % 83 (*)    Neutro Abs 9.0 (*)    All other components within normal limits  COMPREHENSIVE METABOLIC PANEL - Abnormal; Notable for the following:    Glucose, Bld 130 (*)    Total Protein 8.6 (*)    Total Bilirubin 0.2 (*)    All other components within normal limits  URINALYSIS, ROUTINE W REFLEX MICROSCOPIC - Abnormal; Notable for the following:    APPearance CLOUDY (*)    Ketones, ur 15 (*)    All other components within normal limits   Dg Abd Acute W/chest  06/26/2013   *RADIOLOGY  REPORT*  Clinical Data: Vomiting.  ACUTE ABDOMEN SERIES (ABDOMEN 2 VIEW & CHEST 1 VIEW)  Comparison: Plain films of the abdomen 07/18/2009 and CT abdomen pelvis 04/25/2013.  Findings: Single view of the chest demonstrates clear lungs and normal heart size.  No pneumothorax or pleural fluid.  Two views of the abdomen show no free peritoneal air.  The bowel gas pattern is unremarkable.  No focal bony abnormality is identified.  IMPRESSION: Negative exam.   Original Report Authenticated By: Holley Dexter, M.D.   1. Nausea vomiting and diarrhea     MDM  42 yo female with one day of n/v/d.  Pt also recent incarcerated ventral hernia repair.  Will check acute abd series for obstruction.  Will treat nausea.  Labs reassuring.  12:42 AM Pt feeling much better.  No signs of obstruction.  Will d/c with zofran and brat diet instructions  Olivia Mackie, MD 06/27/13 [phone]

## 2013-06-26 NOTE — ED Notes (Signed)
PT. REPORTS NAUSEA AND VOMITTING WITH DIARRHEA ONSET THIS MORNING AFTER EATING SPINACH .

## 2013-06-27 LAB — URINALYSIS, ROUTINE W REFLEX MICROSCOPIC
Bilirubin Urine: NEGATIVE
Glucose, UA: NEGATIVE mg/dL
Hgb urine dipstick: NEGATIVE
Nitrite: NEGATIVE
Specific Gravity, Urine: 1.028 (ref 1.005–1.030)
pH: 5.5 (ref 5.0–8.0)

## 2013-06-27 MED ORDER — ONDANSETRON 4 MG PO TBDP: 4.0000 mg | ORAL_TABLET | Freq: Three times a day (TID) | ORAL | Status: DC | PRN

## 2013-06-28 ENCOUNTER — Encounter (HOSPITAL_COMMUNITY): Payer: Self-pay

## 2013-06-28 ENCOUNTER — Emergency Department (HOSPITAL_COMMUNITY)
Admission: EM | Admit: 2013-06-28 | Discharge: 2013-06-28 | Disposition: A | Discharge status: Home / Self Care | Payer: Medicaid Other | Attending: Emergency Medicine | Admitting: Emergency Medicine

## 2013-06-28 DIAGNOSIS — R6883 Chills (without fever): Secondary | ICD-10-CM | POA: Insufficient documentation

## 2013-06-28 DIAGNOSIS — R110 Nausea: Secondary | ICD-10-CM | POA: Insufficient documentation

## 2013-06-28 DIAGNOSIS — E669 Obesity, unspecified: Secondary | ICD-10-CM | POA: Insufficient documentation

## 2013-06-28 DIAGNOSIS — Z9889 Other specified postprocedural states: Secondary | ICD-10-CM | POA: Insufficient documentation

## 2013-06-28 DIAGNOSIS — Z90710 Acquired absence of both cervix and uterus: Secondary | ICD-10-CM | POA: Insufficient documentation

## 2013-06-28 LAB — CBC WITH DIFFERENTIAL/PLATELET
Basophils Relative: 0 % (ref 0–1)
Eosinophils Absolute: 0 10*3/uL (ref 0.0–0.7)
Eosinophils Relative: 0 % (ref 0–5)
Hemoglobin: 13.1 g/dL (ref 12.0–15.0)
Lymphs Abs: 2.4 10*3/uL (ref 0.7–4.0)
MCH: 32.4 pg (ref 26.0–34.0)
MCHC: 34.7 g/dL (ref 30.0–36.0)
MCV: 93.3 fL (ref 78.0–100.0)
Monocytes Absolute: 0.7 10*3/uL (ref 0.1–1.0)
Monocytes Relative: 7 % (ref 3–12)
RBC: 4.04 MIL/uL (ref 3.87–5.11)

## 2013-06-28 LAB — BASIC METABOLIC PANEL
BUN: 19 mg/dL (ref 6–23)
Calcium: 9.2 mg/dL (ref 8.4–10.5)
Creatinine, Ser: 1 mg/dL (ref 0.50–1.10)
GFR calc Af Amer: 81 mL/min — ABNORMAL LOW (ref >90)
GFR calc non Af Amer: 70 mL/min — ABNORMAL LOW (ref >90)
Glucose, Bld: 111 mg/dL — ABNORMAL HIGH (ref 70–99)

## 2013-06-28 MED ORDER — PROMETHAZINE HCL 25 MG PO TABS
25.0000 mg | ORAL_TABLET | ORAL | Status: AC
  Administered 2013-06-28: 25 mg via ORAL
  Filled 2013-06-28: qty 1

## 2013-06-28 MED ORDER — LORAZEPAM 2 MG/ML IJ SOLN
1.0000 mg | Freq: Once | INTRAMUSCULAR | Status: AC
  Administered 2013-06-28: 1 mg via INTRAVENOUS
  Filled 2013-06-28: qty 1

## 2013-06-28 MED ORDER — PROMETHAZINE HCL 25 MG PO TABS: 25.0000 mg | ORAL_TABLET | Freq: Four times a day (QID) | ORAL | Status: DC | PRN

## 2013-06-28 NOTE — ED Notes (Signed)
Pt was seen here on the 2nd for nausea and given rx for zofran for it but it is not working. States she is also having chills. States she is vomiting a little white frothy emesis at times. States she has been sticking to her [employer]

## 2013-06-28 NOTE — ED Provider Notes (Signed)
History    CSN: [id number] Arrival date & time 06/28/13  0455  First MD Initiated Contact with Patient 06/28/13 0501     Chief Complaint  Patient presents with  . Nausea  . Chills   (Consider location/radiation/quality/duration/timing/severity/associated sxs/prior Treatment) HPI  Tashai L Chenette is a 42 y.o. female complaining of complaining of nausea and chills for the last 2 days patient was recently seen for multiple episodes of emesis x2 days ago. She is status post ventral hernia repair on June 12. Patient denies abdominal pain, fever,change in bowel or bladder habits, chest pain, shortness of breath.Marland Kitchen She's been taking Zofran every 4 hours with little relief. She has been compliant with BRAT diet.   Past Medical History  Diagnosis Date  . Medical history non-contributory    Past Surgical History  Procedure Laterality Date  . Hernia repair  06/06/2013  . Laparoscopic incisional / umbilical / ventral hernia repair  06/06/2013    VHR  . Abdominal hysterectomy  01/2011    Partial  . Dilation and curettage of uterus  2012  . Ventral hernia repair N/A 06/06/2013    Procedure: LAPAROSCOPIC VENTRAL HERNIA;  Surgeon: Clovis Pu. Cornett, MD;  Location: [facility];  Service: General;  Laterality: N/A;   No family history on file. History  Substance Use Topics  . Smoking status: Never Smoker   . Smokeless tobacco: Never Used  . Alcohol Use: Yes     Comment: 06/06/2013 "maybe a beer once or twice/month"   OB History   Grav Para Term Preterm Abortions TAB SAB Ect Mult Living                 Review of Systems  Constitutional:       Negative except as described in HPI  HENT:       Negative except as described in HPI  Respiratory:       Negative except as described in HPI  Cardiovascular:       Negative except as described in HPI  Gastrointestinal:       Negative except as described in HPI  Genitourinary:       Negative except as described in HPI  Musculoskeletal:       Negative  except as described in HPI  Skin:       Negative except as described in HPI  Neurological:       Negative except as described in HPI  All other systems reviewed and are negative.    Allergies  Review of patient's allergies indicates no known allergies.  Home Medications   Current Outpatient Rx  Name  Route  Sig  Dispense  Refill  . HYDROcodone-acetaminophen (NORCO/VICODIN) 5-325 MG per tablet   Oral   Take 1 tablet by mouth every 6 (six) hours as needed for pain.         Marland Kitchen ondansetron (ZOFRAN ODT) 4 MG disintegrating tablet   Oral   Take 1 tablet (4 mg total) by mouth every 8 (eight) hours as needed for nausea.   20 tablet   0   . polyethylene glycol (MIRALAX / GLYCOLAX) packet   Oral   Take 17 g by mouth 2 (two) times daily.          BP 141/96  Pulse 68  Temp(Src) 98.5 F (36.9 C) (Oral)  Resp 22  SpO2 100% Physical Exam  Nursing note and vitals reviewed. Constitutional: She is oriented to person, place, and time. She appears well-developed and well-nourished.  No distress.  Obese  HENT:  Head: Normocephalic and atraumatic.  Mouth/Throat: Oropharynx is clear and moist.  MMM  Eyes: Conjunctivae and EOM are normal. Pupils are equal, round, and reactive to light.  Neck: Normal range of motion.  Cardiovascular: Normal rate, regular rhythm, normal heart sounds and intact distal pulses.   Pulmonary/Chest: Effort normal and breath sounds normal. No stridor. No respiratory distress. She has no wheezes. She has no rales. She exhibits no tenderness.  Abdominal: Soft. Bowel sounds are normal. She exhibits no distension and no mass. There is no tenderness. There is no rebound and no guarding.  Well-healing laparoscopic incisions with Steri-Strips in place, and no induration or discharge. Patient is nontender to deep palpation of all quadrants  Musculoskeletal: Normal range of motion.  Neurological: She is alert and oriented to person, place, and time.  Psychiatric: She  has a normal mood and affect.    ED Course  Procedures (including critical care time) Labs Reviewed  CBC WITH DIFFERENTIAL - Abnormal; Notable for the following:    Platelets 422 (*)    All other components within normal limits  BASIC METABOLIC PANEL - Abnormal; Notable for the following:    Glucose, Bld 111 (*)    GFR calc non Af Amer 70 (*)    GFR calc Af Amer 81 (*)    All other components within normal limits   1. Nausea alone     MDM   Filed Vitals:   06/28/13 0503  BP: 141/96  Pulse: 68  Temp: 98.5 F (36.9 C)  TempSrc: Oral  Resp: 22  SpO2: 100%     Lorriann L Gadberry is a 42 y.o. female with persistent nausea, she is tolerating by mouth. Abdominal exam is benign, no tenderness to palpation in any quadrants; Doubt postsurgical complication. IV Ativan and by mouth Phenergan ordered.   Patient is tolerating by mouth,reports subjective improvement.  Medications  LORazepam (ATIVAN) injection 1 mg (not administered)  promethazine (PHENERGAN) tablet 25 mg (not administered)    Pt is hemodynamically stable, appropriate for, and amenable to discharge at this time. Pt verbalized understanding and agrees with care plan. Outpatient follow-up and specific return precautions discussed.    New Prescriptions   PROMETHAZINE (PHENERGAN) 25 MG TABLET    Take 1 tablet (25 mg total) by mouth every 6 (six) hours as needed for nausea.     Wynetta Emery, PA-C 06/28/13 [phone]

## 2013-06-28 NOTE — ED Notes (Signed)
Nicole, PA back at the bedside.  

## 2013-06-28 NOTE — ED Notes (Signed)
Pt waking up more and states she still feels nauseated.

## 2013-06-28 NOTE — ED Notes (Signed)
Joni Reining, PA in to speak with patient.

## 2013-06-29 NOTE — ED Provider Notes (Signed)
Medical screening examination/treatment/procedure(s) were performed by non-physician practitioner and as supervising physician I was immediately available for consultation/collaboration.    Chelsey Kimberley D Jermane Brayboy, MD 06/29/13 0533 

## 2013-07-26 ENCOUNTER — Encounter (INDEPENDENT_AMBULATORY_CARE_PROVIDER_SITE_OTHER): Payer: Medicaid Other | Admitting: Surgery

## 2013-08-07 ENCOUNTER — Encounter (INDEPENDENT_AMBULATORY_CARE_PROVIDER_SITE_OTHER): Payer: Self-pay | Admitting: Internal Medicine

## 2013-08-07 ENCOUNTER — Encounter (INDEPENDENT_AMBULATORY_CARE_PROVIDER_SITE_OTHER): Payer: Self-pay | Admitting: Surgery

## 2013-12-14 IMAGING — CT CT ABD-PELV W/ CM
3 of 4 series · 13 of 36 positions shown, 19 images · IV contrast (READICAT/WATER & [ID] OMNI 300)
Comparison: 08/30/2010

CLINICAL DATA: Ventral hernia.

CT ABDOMEN AND PELVIS WITH CONTRAST
TECHNIQUE: Multidetector CT imaging of the abdomen and pelvis was
performed following the standard protocol during bolus
administration of intravenous contrast.
Contrast: 125mL OMNIPAQUE IOHEXOL 300 MG/ML  SOLN

[Series 3: abd/pelvis with · axial · 0.88mm/px · z∈[-313,+12]mm · 7 of 87 slices shown, 12 images]
[im 11/87  soft-tissue]
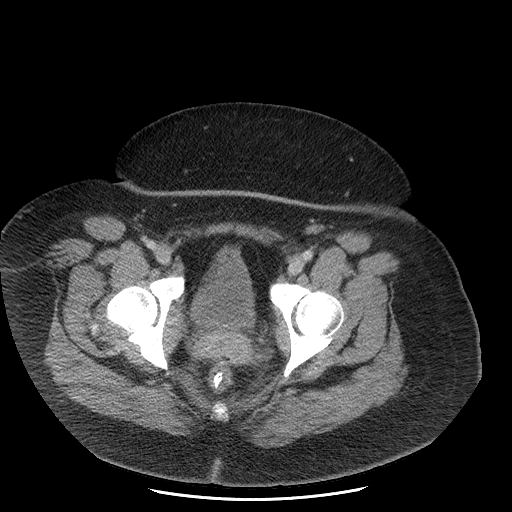
[im 11/87  bone]
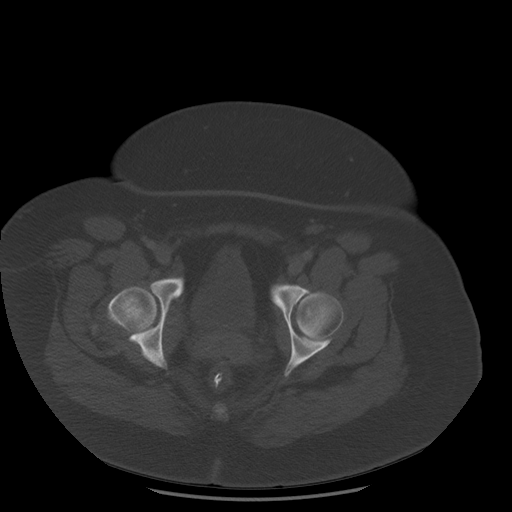
[im 22/87  soft-tissue]
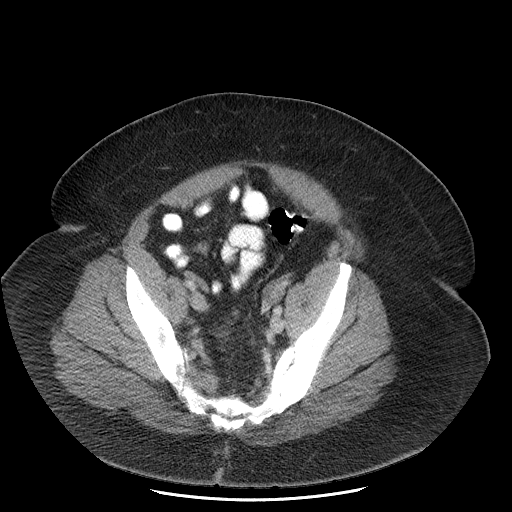
[im 33/87  soft-tissue]
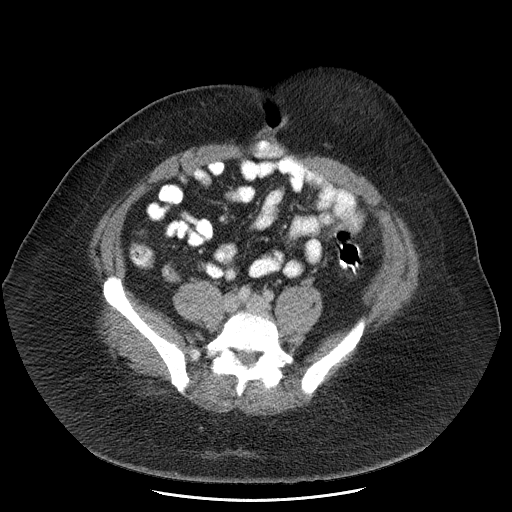
[im 44/87  soft-tissue]
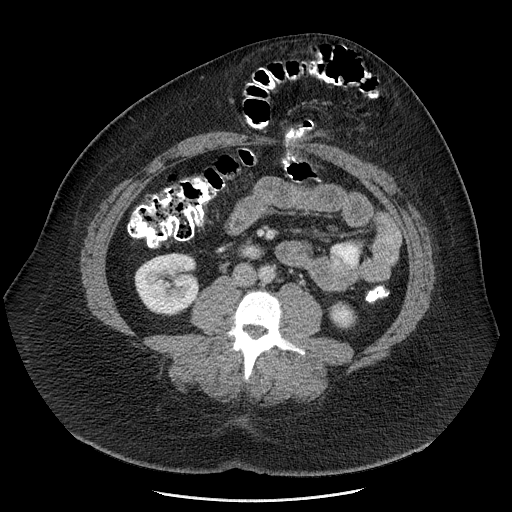
[im 44/87  lung]
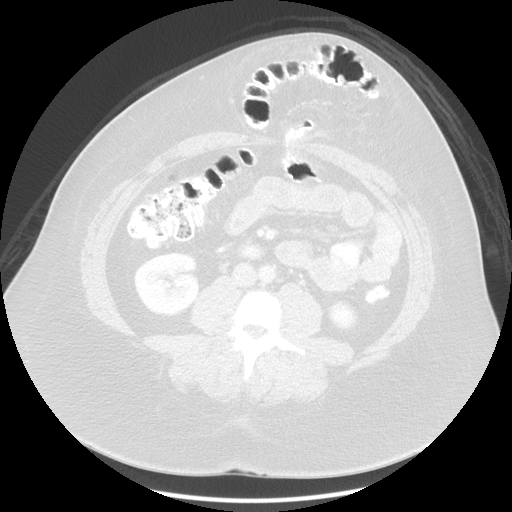
[im 54/87  soft-tissue]
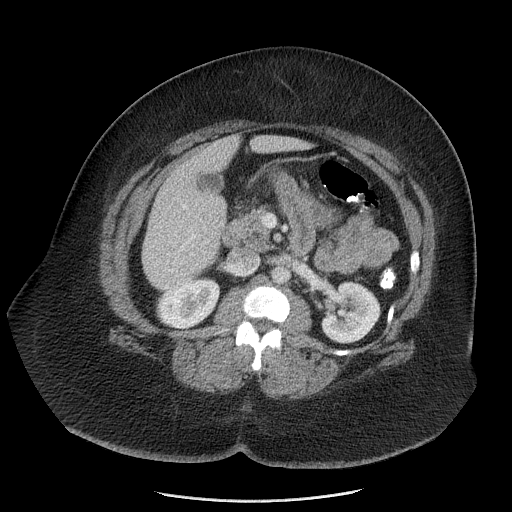
[im 54/87  lung]
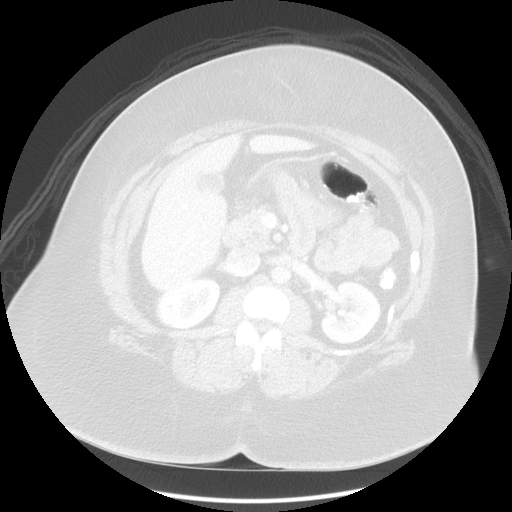
[im 65/87  soft-tissue]
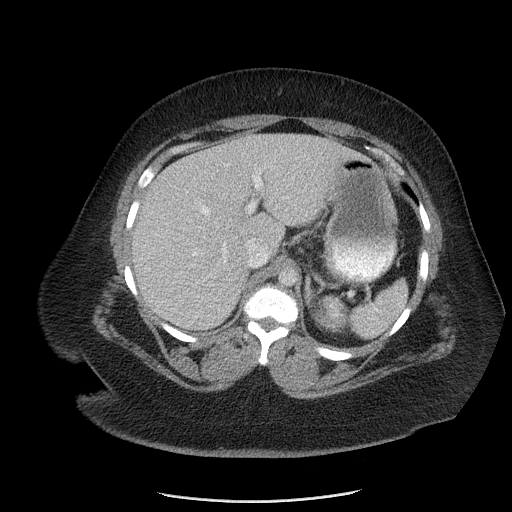
[im 65/87  lung]
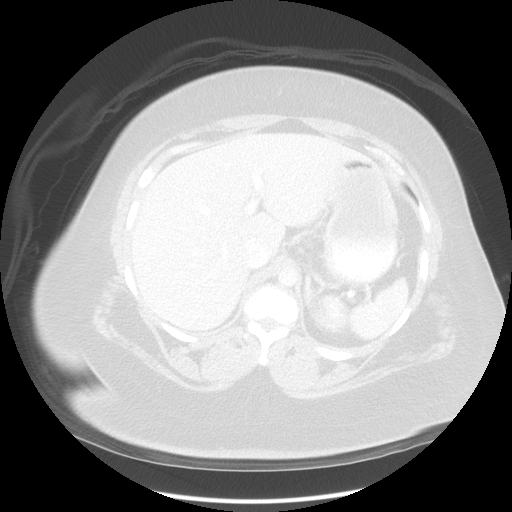
[im 76/87  soft-tissue]
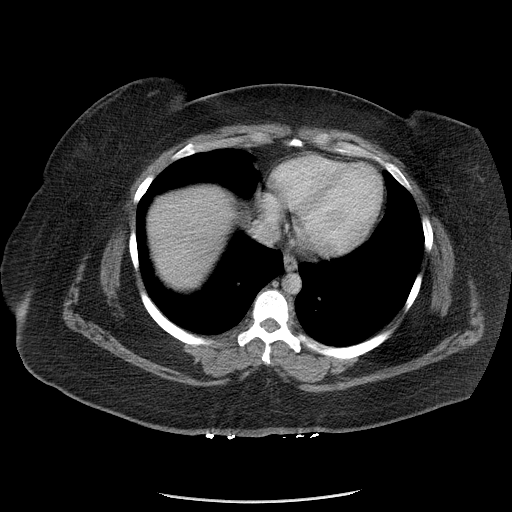
[im 76/87  lung]
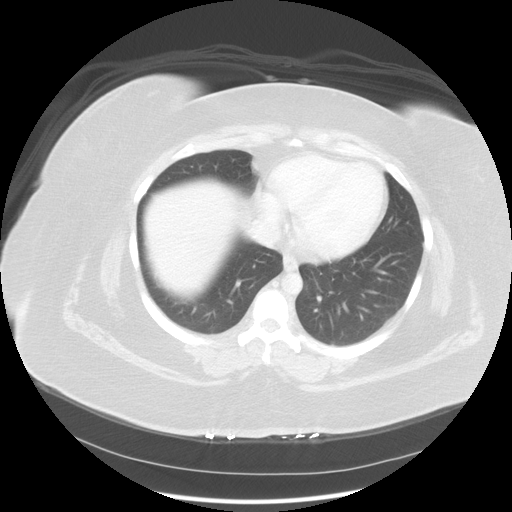

[Series 601: coronal body · coronal · 0.89mm/px · 1 of 162 slices shown, 2 images]
[im 54/162  soft-tissue]
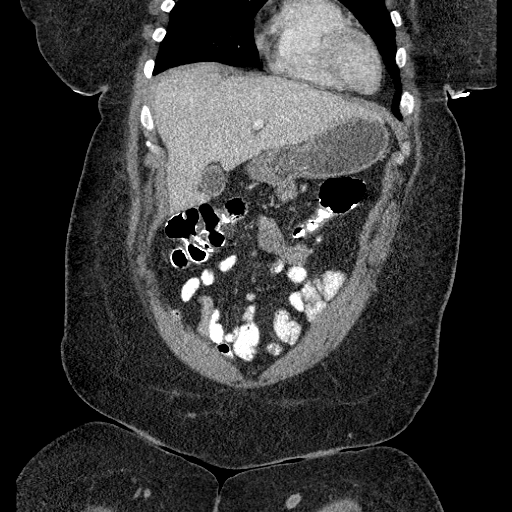
[im 54/162  bone]
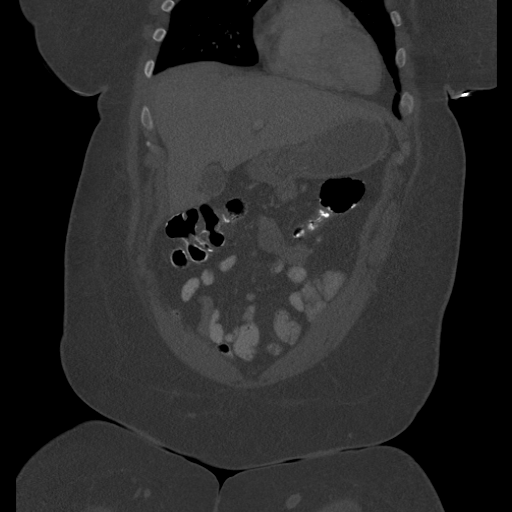

[Series 602: sagittal body · sagittal · 0.89mm/px · 5 of 181 slices shown]
[im 19/181  soft-tissue]
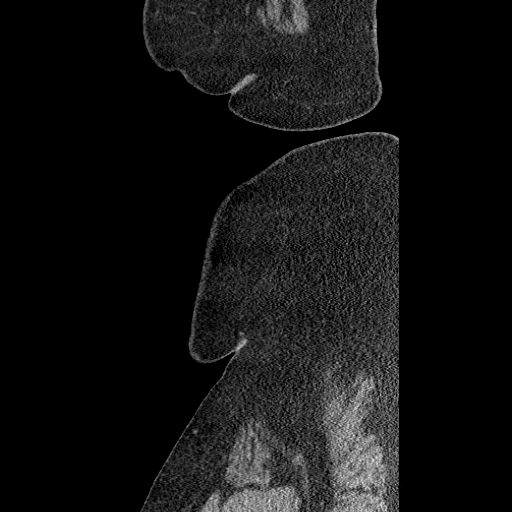
[im 38/181  soft-tissue]
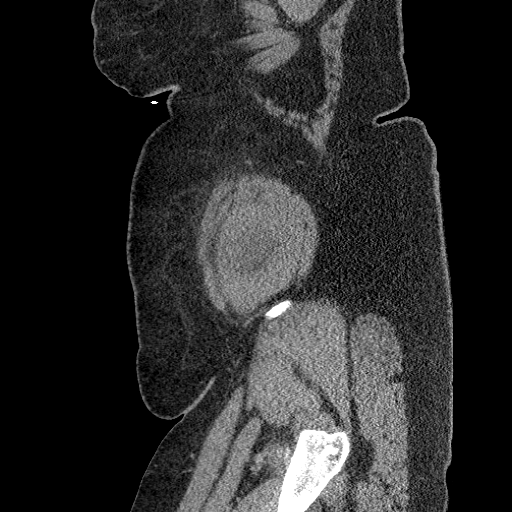
[im 57/181  soft-tissue]
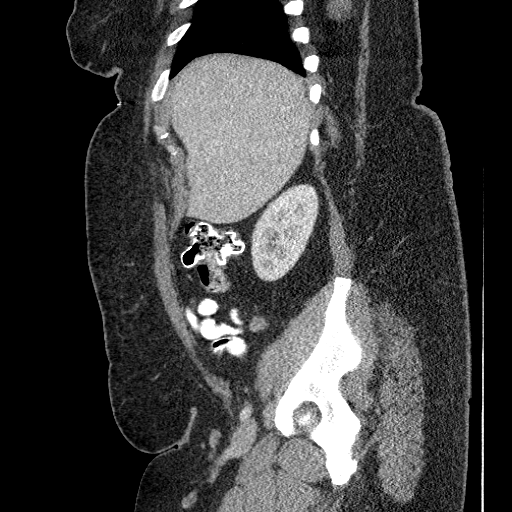
[im 76/181  soft-tissue]
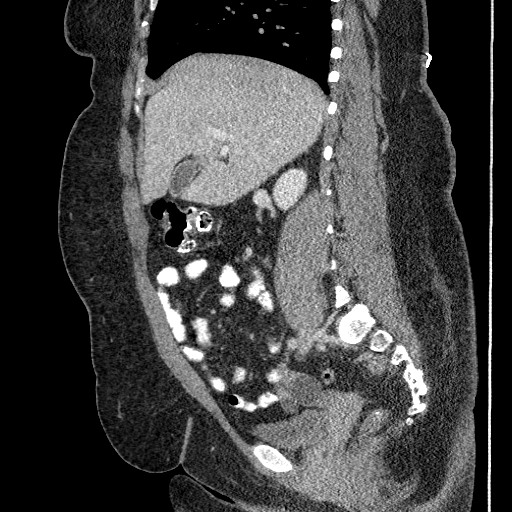
[im 105/181  soft-tissue]
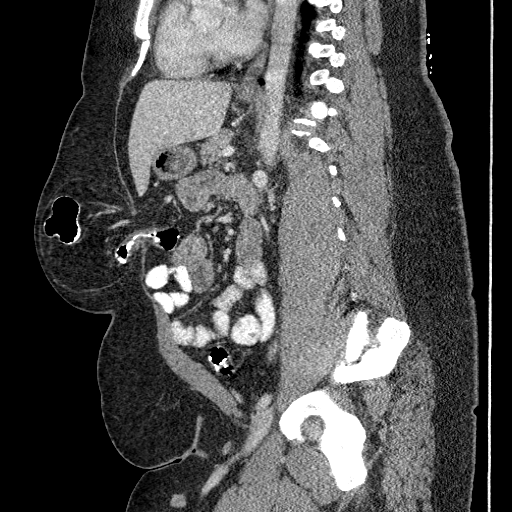

[13 of 36 positions shown; findings below may reference images not displayed]

FINDINGS: Lung bases are clear.  No effusions.  Heart is normal
size.

There is a midline ventral hernia superior to the umbilicus.  This
contains the mid transverse colon and is stable since 0499.  No
evidence of bowel obstruction.

Liver, spleen, gallbladder, pancreas, adrenals and kidneys are
unremarkable.  Aorta is normal caliber.  Small bowel is
decompressed.

Uterus and left ovary unremarkable.  Small low-density area in the
right ovary, likely small cysts.  This measures 2.6 cm.  Urinary
bladder decompressed, grossly unremarkable.

No acute bony abnormality.  No acute bony abnormality.
IMPRESSION: Stable large ventral hernia above the umbilicus containing the
transverse colon.  No evidence of obstruction.

## 2014-08-08 ENCOUNTER — Encounter (HOSPITAL_COMMUNITY): Payer: Self-pay

## 2014-08-08 ENCOUNTER — Inpatient Hospital Stay (HOSPITAL_COMMUNITY)
Admission: AD | Admit: 2014-08-08 | Discharge: 2014-08-08 | Disposition: A | Discharge status: Home / Self Care | Payer: Self-pay | Source: Ambulatory Visit | Attending: Obstetrics and Gynecology | Admitting: Obstetrics and Gynecology

## 2014-08-08 DIAGNOSIS — A5901 Trichomonal vulvovaginitis: Secondary | ICD-10-CM | POA: Insufficient documentation

## 2014-08-08 DIAGNOSIS — Z90711 Acquired absence of uterus with remaining cervical stump: Secondary | ICD-10-CM | POA: Insufficient documentation

## 2014-08-08 LAB — URINALYSIS, ROUTINE W REFLEX MICROSCOPIC
Bilirubin Urine: NEGATIVE
GLUCOSE, UA: NEGATIVE mg/dL
Ketones, ur: NEGATIVE mg/dL
NITRITE: NEGATIVE
PH: 6 (ref 5.0–8.0)
PROTEIN: NEGATIVE mg/dL
Specific Gravity, Urine: 1.025 (ref 1.005–1.030)
Urobilinogen, UA: 1 mg/dL (ref 0.0–1.0)

## 2014-08-08 LAB — URINE MICROSCOPIC-ADD ON

## 2014-08-08 LAB — WET PREP, GENITAL
Clue Cells Wet Prep HPF POC: NONE SEEN
Yeast Wet Prep HPF POC: NONE SEEN

## 2014-08-08 MED ORDER — METRONIDAZOLE 500 MG PO TABS
2000.0000 mg | ORAL_TABLET | Freq: Once | ORAL | Status: AC
  Administered 2014-08-08: 2000 mg via ORAL
  Filled 2014-08-08: qty 4

## 2014-08-08 NOTE — Discharge Instructions (Signed)
Trichomoniasis °Trichomoniasis is an infection caused by an organism called Trichomonas. The infection can affect both women and men. In women, the outer female genitalia and the vagina are affected. In men, the penis is mainly affected, but the prostate and other reproductive organs can also be involved. Trichomoniasis is a sexually transmitted infection (STI) and is most often passed to another person through sexual contact.  °RISK FACTORS °· Having unprotected sexual intercourse. °· Having sexual intercourse with an infected partner. °SIGNS AND SYMPTOMS  °Symptoms of trichomoniasis in women include: °· Abnormal gray-green frothy vaginal discharge. °· Itching and irritation of the vagina. °· Itching and irritation of the area outside the vagina. °Symptoms of trichomoniasis in men include:  °· Penile discharge with or without pain. °· Pain during urination. This results from inflammation of the urethra. °DIAGNOSIS  °Trichomoniasis may be found during a Pap test or physical exam. Your health care provider may use one of the following methods to help diagnose this infection: °· Examining vaginal discharge under a microscope. For men, urethral discharge would be examined. °· Testing the pH of the vagina with a test tape. °· Using a vaginal swab test that checks for the Trichomonas organism. A test is available that provides results within a few minutes. °· Doing a culture test for the organism. This is not usually needed. °TREATMENT  °· You may be given medicine to fight the infection. Women should inform their health care provider if they could be or are pregnant. Some medicines used to treat the infection should not be taken during pregnancy. °· Your health care provider may recommend over-the-counter medicines or creams to decrease itching or irritation. °· Your sexual partner will need to be treated if infected. °HOME CARE INSTRUCTIONS  °· Take medicines only as directed by your health care provider. °· Take  over-the-counter medicine for itching or irritation as directed by your health care provider. °· Do not have sexual intercourse while you have the infection. °· Women should not douche or wear tampons while they have the infection. °· Discuss your infection with your partner. Your partner may have gotten the infection from you, or you may have gotten it from your partner. °· Have your sex partner get examined and treated if necessary. °· Practice safe, informed, and protected sex. °· See your health care provider for other STI testing. °SEEK MEDICAL CARE IF:  °· You still have symptoms after you finish your medicine. °· You develop abdominal pain. °· You have pain when you urinate. °· You have bleeding after sexual intercourse. °· You develop a rash. °· Your medicine makes you sick or makes you throw up (vomit). °MAKE SURE YOU: °· Understand these instructions. °· Will watch your condition. °· Will get help right away if you are not doing well or get worse. °Document Released: 06/07/2001 Document Revised: 04/28/2014 Document Reviewed: 09/23/2013 °ExitCare® Patient Information ©2015 [employer]. This information is not intended to replace advice given to you by your health care provider. Make sure you discuss any questions you have with your health care provider. ° °Sexually Transmitted Disease °A sexually transmitted disease (STD) is a disease or infection that may be passed (transmitted) from person to person, usually during sexual activity. This may happen by way of saliva, semen, blood, vaginal mucus, or urine. Common STDs include:  °· Gonorrhea.   °· Chlamydia.   °· Syphilis.   °· HIV and AIDS.   °· Genital herpes.   °· Hepatitis B and C.   °· Trichomonas.   °· Human papillomavirus (  HPV).   °· Pubic lice.   °· Scabies. °· Mites. °· Bacterial vaginosis. °WHAT ARE CAUSES OF STDs? °An STD may be caused by bacteria, a virus, or parasites. STDs are often transmitted during sexual activity if one person is infected.  However, they may also be transmitted through nonsexual means. STDs may be transmitted after:  °· Sexual intercourse with an infected person.   °· Sharing sex toys with an infected person.   °· Sharing needles with an infected person or using unclean piercing or tattoo needles. °· Having intimate contact with the genitals, mouth, or rectal areas of an infected person.   °· Exposure to infected fluids during birth. °WHAT ARE THE SIGNS AND SYMPTOMS OF STDs? °Different STDs have different symptoms. Some people may not have any symptoms. If symptoms are present, they may include:  °· Painful or bloody urination.   °· Pain in the pelvis, abdomen, vagina, anus, throat, or eyes.   °· A skin rash, itching, or irritation. °· Growths, ulcerations, blisters, or sores in the genital and anal areas. °· Abnormal vaginal discharge with or without bad odor.   °· Penile discharge in men.   °· Fever.   °· Pain or bleeding during sexual intercourse.   °· Swollen glands in the groin area.   °· Yellow skin and eyes (jaundice). This is seen with hepatitis.   °· Swollen testicles. °· Infertility. °· Sores and blisters in the mouth. °HOW ARE STDs DIAGNOSED? °To make a diagnosis, your health care provider may:  °· Take a medical history.   °· Perform a physical exam.   °· Take a sample of any discharge to examine. °· Swab the throat, cervix, opening to the penis, rectum, or vagina for testing. °· Test a sample of your first morning urine.   °· Perform blood tests.   °· Perform a Pap test, if this applies.   °· Perform a colposcopy.   °· Perform a laparoscopy.   °HOW ARE STDs TREATED? ° Treatment depends on the STD. Some STDs may be treated but not cured.  °· Chlamydia, gonorrhea, trichomonas, and syphilis can be cured with antibiotic medicine.   °· Genital herpes, hepatitis, and HIV can be treated, but not cured, with prescribed medicines. The medicines lessen symptoms.   °· Genital warts from HPV can be treated with medicine or by  freezing, burning (electrocautery), or surgery. Warts may come back.   °· HPV cannot be cured with medicine or surgery. However, abnormal areas may be removed from the cervix, vagina, or vulva.   °· If your diagnosis is confirmed, your recent sexual partners need treatment. This is true even if they are symptom-free or have a negative culture or evaluation. They should not have sex until their health care providers say it is okay. °HOW CAN I REDUCE MY RISK OF GETTING AN STD? °Take these steps to reduce your risk of getting an STD: °· Use latex condoms, dental dams, and water-soluble lubricants during sexual activity. Do not use petroleum jelly or oils. °· Avoid having multiple sex partners. °· Do not have sex with someone who has other sex partners. °· Do not have sex with anyone you do not know or who is at high risk for an STD. °· Avoid risky sex practices that can break your skin. °· Do not have sex if you have open sores on your mouth or skin. °· Avoid drinking too much alcohol or taking illegal drugs. Alcohol and drugs can affect your judgment and put you in a vulnerable position. °· Avoid engaging in oral and anal sex acts. °· Get vaccinated for HPV and hepatitis. If you   have not received these vaccines in the past, talk to your health care provider about whether one or both might be right for you.   °· If you are at risk of being infected with HIV, it is recommended that you take a prescription medicine daily to prevent HIV infection. This is called pre-exposure prophylaxis (PrEP). You are considered at risk if: °¨ You are a man who has sex with other men (MSM). °¨ You are a heterosexual man or woman and are sexually active with more than one partner. °¨ You take drugs by injection. °¨ You are sexually active with a partner who has HIV. °· Talk with your health care provider about whether you are at high risk of being infected with HIV. If you choose to begin PrEP, you should first be tested for HIV. You  should then be tested every 3 months for as long as you are taking PrEP.   °WHAT SHOULD I DO IF I THINK I HAVE AN STD? °· See your health care provider.   °· Tell your sexual partner(s). They should be tested and treated for any STDs. °· Do not have sex until your health care provider says it is okay.  °WHEN SHOULD I GET IMMEDIATE MEDICAL CARE? °Contact your health care provider right away if:  °· You have severe abdominal pain. °· You are a man and notice swelling or pain in your testicles. °· You are a woman and notice swelling or pain in your vagina. °Document Released: 03/04/2003 Document Revised: 12/17/2013 Document Reviewed: 07/02/2013 °ExitCare® Patient Information ©2015 [employer]. This information is not intended to replace advice given to you by your health care provider. Make sure you discuss any questions you have with your health care provider. ° °

## 2014-08-08 NOTE — MAU Note (Signed)
Pt states here for clear vaginal discharge. Slight odor noted x3-4 days. Notes itching as well.

## 2014-08-08 NOTE — MAU Note (Signed)
Having vaginal itching, abd pain, feels like her Alpha Gula is just hurting.  ? Soap irritation. Started about 3 days ago.  Irritation causes pain when she pees

## 2014-08-08 NOTE — MAU Provider Note (Signed)
History     CSN: [id number]  Arrival date and time: 08/08/14 1628   First Provider Initiated Contact with Patient 08/08/14 1844      Chief Complaint  Patient presents with   Vaginal Itching   Abdominal Pain   HPI Morgan [name] 4 [id number] s/p partial hysterectomy complains of 3 days of vaginal irritation.  It is continuously bothering her including when she urinates.  She has no discharge, no bleeding.  She does have very mild abdominal pain.  She does not have a menstrual cycle.  She had sex 6 days ago and noted light bloody discharge that day only.   OB History   Grav Para Term Preterm Abortions TAB SAB Ect Mult Living   2 2 2       2       Past Medical History  Diagnosis Date   Medical history non-contributory     Past Surgical History  Procedure Laterality Date   Hernia repair  06/06/2013   Laparoscopic incisional / umbilical / ventral hernia repair  06/06/2013    VHR   Abdominal hysterectomy  01/2011    Partial   Dilation and curettage of uterus  2012   Ventral hernia repair N/A 06/06/2013    Procedure: LAPAROSCOPIC VENTRAL HERNIA;  Surgeon: Marcello Moores A. Cornett, MD;  Location: [facility];  Service: General;  Laterality: N/A;   Cesarean section      History reviewed. No pertinent family history.  History  Substance Use Topics   Smoking status: Never Smoker    Smokeless tobacco: Never Used   Alcohol Use: Yes     Comment: 06/06/2013 "maybe a beer once or twice/month"    Allergies: No Known Allergies  No prescriptions prior to admission    Review of Systems  Constitutional: Negative for fever and chills.  HENT: Negative for congestion and sore throat.   Eyes: Negative for blurred vision.  Respiratory: Negative for cough, shortness of breath and wheezing.   Cardiovascular: Negative for chest pain and palpitations.  Gastrointestinal: Positive for abdominal pain. Negative for heartburn, nausea, vomiting, diarrhea and constipation.  Genitourinary:  Positive for dysuria. Negative for urgency, frequency and hematuria.  Skin: Positive for itching. Negative for rash.  Neurological: Negative for dizziness, weakness and headaches.   Physical Exam   Blood pressure 147/97, pulse 83, temperature 98.4 F (36.9 C), temperature source Oral, resp. rate 20, height 5\' 1"  (1.549 m), weight 111.131 kg (245 lb).  Physical Exam  Constitutional: She is oriented to person, place, and time. She appears well-developed and well-nourished. No distress.  HENT:  Head: Normocephalic and atraumatic.  Eyes: EOM are normal.  Neck: Normal range of motion.  Cardiovascular: Normal rate, regular rhythm and normal heart sounds.   Respiratory: Effort normal and breath sounds normal.  GI: Soft. Bowel sounds are normal. She exhibits distension. There is no tenderness.  Genitourinary: Uterus normal.  White, thin, frothy, malodorous discharge  Musculoskeletal: Normal range of motion.  Neurological: She is alert and oriented to person, place, and time.  Skin: Skin is warm and dry.  Psychiatric: She has a normal mood and affect.   Results for orders placed during the hospital encounter of 08/08/14 (from the past 24 hour(s))  URINALYSIS, ROUTINE W REFLEX MICROSCOPIC     Status: Abnormal   Collection Time    08/08/14  5:05 PM      Result Value Ref Range   Color, Urine YELLOW  YELLOW   APPearance HAZY (*) CLEAR   Specific  Gravity, Urine 1.025  1.005 - 1.030   pH 6.0  5.0 - 8.0   Glucose, UA NEGATIVE  NEGATIVE mg/dL   Hgb urine dipstick TRACE (*) NEGATIVE   Bilirubin Urine NEGATIVE  NEGATIVE   Ketones, ur NEGATIVE  NEGATIVE mg/dL   Protein, ur NEGATIVE  NEGATIVE mg/dL   Urobilinogen, UA 1.0  0.0 - 1.0 mg/dL   Nitrite NEGATIVE  NEGATIVE   Leukocytes, UA MODERATE (*) NEGATIVE  URINE MICROSCOPIC-ADD ON     Status: Abnormal   Collection Time    08/08/14  5:05 PM      Result Value Ref Range   Squamous Epithelial / LPF FEW (*) RARE   WBC, UA 11-20  <3 WBC/hpf   RBC  / HPF 0-2  <3 RBC/hpf   Urine-Other TRICHOMONAS PRESENT    WET PREP, GENITAL     Status: Abnormal   Collection Time    08/08/14  7:00 PM      Result Value Ref Range   Yeast Wet Prep HPF POC NONE SEEN  NONE SEEN   Trich, Wet Prep MODERATE (*) NONE SEEN   Clue Cells Wet Prep HPF POC NONE SEEN  NONE SEEN   WBC, Wet Prep HPF POC FEW (*) NONE SEEN    MAU Course  Procedures None  MDM +trich  Assessment and Plan  A: Trichomoniasis  P: Discharge to home Flagyl 2 grams po x 1 dose. No sex x 10 days Inform partners and have them get treatment.  Return to MAU for emergencies.   Advised she may go to [facility] for STD screening/treatments.   See PCP for elevated blood pressure.    Haynes Kerns Clark 08/08/2014, 6:46 PM

## 2014-08-08 NOTE — MAU Provider Note (Signed)
Attestation of Attending Supervision of Advanced Practitioner: Evaluation and management procedures were performed by the PA/NP/CNM/OB Fellow under my supervision/collaboration. Chart reviewed and agree with management and plan.  Saadiya Wilfong V 08/08/2014 10:39 PM

## 2014-08-09 LAB — GC/CHLAMYDIA PROBE AMP
CT Probe RNA: NEGATIVE
GC Probe RNA: NEGATIVE

## 2014-10-27 ENCOUNTER — Encounter (HOSPITAL_COMMUNITY): Payer: Self-pay

## 2022-10-20 ENCOUNTER — Encounter (HOSPITAL_COMMUNITY): Payer: Self-pay

## 2022-10-20 ENCOUNTER — Ambulatory Visit (HOSPITAL_COMMUNITY)
Admission: EM | Admit: 2022-10-20 | Discharge: 2022-10-20 | Disposition: A | Discharge status: Home / Self Care | Payer: Managed Care, Other (non HMO) | Attending: Sports Medicine | Admitting: Sports Medicine

## 2022-10-20 DIAGNOSIS — K529 Noninfective gastroenteritis and colitis, unspecified: Secondary | ICD-10-CM | POA: Diagnosis not present

## 2022-10-20 DIAGNOSIS — R197 Diarrhea, unspecified: Secondary | ICD-10-CM

## 2022-10-20 MED ORDER — ONDANSETRON 4 MG PO TBDP
ORAL_TABLET | ORAL | Status: AC
  Filled 2022-10-20: qty 1

## 2022-10-20 MED ORDER — ONDANSETRON 4 MG PO TBDP
4.0000 mg | ORAL_TABLET | Freq: Once | ORAL | Status: AC
  Administered 2022-10-20: 4 mg via ORAL

## 2022-10-20 MED ORDER — ONDANSETRON HCL 4 MG PO TABS: 4.0000 mg | ORAL_TABLET | Freq: Three times a day (TID) | ORAL | 0 refills | Status: DC | PRN

## 2022-10-20 NOTE — Discharge Instructions (Addendum)
You were treated today for a viral gastroenteritis.  Continue to stay hydrated with lots of water and recommend brat diet, toast, applesauce, plain rice.  You may use Pedialyte or Gatorade once a day for your appetite remains very small.  Caution with sugary beverages.  I have also sent Zofran to your pharmacy to help with your nausea.  Follow-up with your primary care provider if you have not had any improvement over the next couple days.

## 2022-10-20 NOTE — ED Provider Notes (Signed)
[facility]    CSN: [id number] Arrival date & time: 10/20/22  5038    History   Chief Complaint Chief Complaint  Patient presents with   Abdominal Pain   Diarrhea    HPI Morgan [name] is a 51 y.o. female.   She presents today with chief complaint of nausea vomiting and diarrhea.  She reports her son called her after the 8 dinner together that he was sick earlier in the week and then she became ill the next day.  She reports this began on Tuesday and has been persistent.  She was able to eat a little bit of chicken and [place] fries yesterday and has been able to tolerate water.  She reports some stomach discomfort for which she has been taking Pepto-Bismol with minimal relief.  She is also been having black stools likely secondary to the Pepto-Bismol.  She denies any fevers recent travel or bloody stools.   Abdominal Pain Associated symptoms: diarrhea   Diarrhea Associated symptoms: abdominal pain     Past Medical History:  Diagnosis Date   Medical history non-contributory     There are no problems to display for this patient.   Past Surgical History:  Procedure Laterality Date   ABDOMINAL HYSTERECTOMY  01/2011   Partial   CESAREAN SECTION     DILATION AND CURETTAGE OF UTERUS  2012   HERNIA REPAIR  06/06/2013   LAPAROSCOPIC INCISIONAL / UMBILICAL / VENTRAL HERNIA REPAIR  06/06/2013   [facility]   VENTRAL HERNIA REPAIR N/A 06/06/2013   Procedure: LAPAROSCOPIC VENTRAL HERNIA;  Surgeon: Marcello Moores A. Cornett, MD;  Location: [facility];  Service: General;  Laterality: N/A;    OB History     Gravida  2   Para  2   Term  2   Preterm      AB      Living  2      SAB      IAB      Ectopic      Multiple      Live Births               Home Medications    Prior to Admission medications   Medication Sig Start Date End Date Taking? Authorizing Provider  ondansetron (ZOFRAN) 4 MG tablet Take 1 tablet (4 mg total) by mouth every 8 (eight) hours as needed  for nausea or vomiting. 10/20/22  Yes Elmore Guise, DO    Family History History reviewed. No pertinent family history.  Social History Social History   Tobacco Use   Smoking status: Never   Smokeless tobacco: Never  Substance Use Topics   Alcohol use: Yes    Comment: 06/06/2013 "maybe a beer once or twice/month"   Drug use: Yes    Types: Marijuana    Comment: 06/06/2013 "infrequent marijuana use; last time was ~ 2 wk ago"     Allergies   Patient has no known allergies.   Review of Systems Review of Systems  Gastrointestinal:  Positive for abdominal pain and diarrhea.  As listed above in HPI   Physical Exam Triage Vital Signs ED Triage Vitals  Enc Vitals Group     BP 10/20/22 0852 126/86     Pulse Rate 10/20/22 0852 81     Resp 10/20/22 0852 12     Temp 10/20/22 0852 98.2 F (36.8 C)     Temp Source 10/20/22 0852 Oral     SpO2 10/20/22 0852 98 %  Weight --      Height --      Head Circumference --      Peak Flow --      Pain Score 10/20/22 0850 6     Pain Loc --      Pain Edu? --      Excl. in Echo? --    No data found.  Updated Vital Signs BP 126/86 (BP Location: Right Arm)   Pulse 81   Temp 98.2 F (36.8 C) (Oral)   Resp 12   SpO2 98%   Physical Exam Vitals reviewed.  Constitutional:      General: She is not in acute distress.    Appearance: She is well-developed. She is obese. She is not toxic-appearing or diaphoretic.  HENT:     Mouth/Throat:     Mouth: Mucous membranes are moist.  Cardiovascular:     Rate and Rhythm: Normal rate.  Pulmonary:     Effort: Pulmonary effort is normal.  Abdominal:     General: Abdomen is flat. Bowel sounds are increased.     Palpations: Abdomen is soft.     Tenderness: There is generalized abdominal tenderness. There is no guarding.  Skin:    General: Skin is warm.  Neurological:     Mental Status: She is alert.      UC Treatments / Results  Labs (all labs ordered are listed, but only  abnormal results are displayed) Labs Reviewed - No data to display  EKG   Radiology No results found.  Procedures Procedures (including critical care time)  Medications Ordered in UC Medications  ondansetron (ZOFRAN-ODT) disintegrating tablet 4 mg (4 mg Oral Given 10/20/22 0916)    Initial Impression / Assessment and Plan / UC Course  I have reviewed the triage vital signs and the nursing notes.  Pertinent labs & imaging results that were available during my care of the patient were reviewed by me and considered in my medical decision making (see chart for details).    She was here today with nausea vomiting and diarrhea.  She was unable to go to work yesterday and was sent home today.  Zofran given today as she was nauseous during her exam she has been able to stay hydrated I recommend hydration and brat diet.  She verbalized understanding. I have sent Zofran to her pharmacy for the next couple days.  Anticipate she will recover very well with rest and supportive treatment.  If her symptoms worsen or fail to improve she should follow-up with her primary care provider.  I believe at this time her dark stools are secondary to Pepto-Bismol use.  If this continues after discontinuation of medication she should follow-up with urgent care or her primary care provider. Work note provided Final Clinical Impressions(s) / UC Diagnoses   Final diagnoses:  Gastroenteritis  Nausea vomiting and diarrhea     Discharge Instructions      You were treated today for a viral gastroenteritis.  Continue to stay hydrated with lots of water and recommend brat diet, toast, applesauce, plain rice.  You may use Pedialyte or Gatorade once a day for your appetite remains very small.  Caution with sugary beverages.  I have also sent Zofran to your pharmacy to help with your nausea.  Follow-up with your primary care provider if you have not had any improvement over the next couple days.      ED  Prescriptions     Medication Sig Dispense Auth. Provider  ondansetron (ZOFRAN) 4 MG tablet Take 1 tablet (4 mg total) by mouth every 8 (eight) hours as needed for nausea or vomiting. 15 tablet Rodena Goldmann A, DO      PDMP not reviewed this encounter.   Elmore Guise, DO 10/20/22 [phone]

## 2022-10-20 NOTE — ED Triage Notes (Signed)
Pt is here for abdominal pain , vomiting and diarrhea. Pt stated she stopped vomiting yesterday .

## 2024-07-18 ENCOUNTER — Encounter (HOSPITAL_COMMUNITY): Payer: Self-pay | Admitting: *Deleted

## 2024-07-18 ENCOUNTER — Ambulatory Visit (HOSPITAL_COMMUNITY)
Admission: EM | Admit: 2024-07-18 | Discharge: 2024-07-18 | Disposition: A | Discharge status: Home / Self Care | Attending: Family Medicine | Admitting: Family Medicine

## 2024-07-18 DIAGNOSIS — Z202 Contact with and (suspected) exposure to infections with a predominantly sexual mode of transmission: Secondary | ICD-10-CM | POA: Insufficient documentation

## 2024-07-18 DIAGNOSIS — N3090 Cystitis, unspecified without hematuria: Secondary | ICD-10-CM | POA: Insufficient documentation

## 2024-07-18 LAB — POCT URINALYSIS DIP (MANUAL ENTRY)
Bilirubin, UA: NEGATIVE
Blood, UA: NEGATIVE
Glucose, UA: NEGATIVE mg/dL
Ketones, POC UA: NEGATIVE mg/dL
Nitrite, UA: NEGATIVE
Protein Ur, POC: NEGATIVE mg/dL
Spec Grav, UA: 1.02 (ref 1.010–1.025)
Urobilinogen, UA: 0.2 U/dL
pH, UA: 5.5 (ref 5.0–8.0)

## 2024-07-18 MED ORDER — NITROFURANTOIN MONOHYD MACRO 100 MG PO CAPS: 100.0000 mg | ORAL_CAPSULE | Freq: Two times a day (BID) | ORAL | 0 refills | Status: AC

## 2024-07-18 NOTE — ED Triage Notes (Signed)
 C/O dysuria onset approx 4 days ago with polyuria and urinary urgency. Took some doses of AZO with some improvement in discomfort.  Pt states she is newly sexually active within the last couple wks after having been not sexually active for a while.  Also reports recently having yeast infection since being sexually active - treated with OTC meds. Pt wishes to have STI check.

## 2024-07-18 NOTE — Discharge Instructions (Addendum)
 Urinalysis showed some white blood cells, which could be consistent with a bladder infection.  Take nitrofurantoin  100 mg--1 capsule 2 times daily for 5 days  Urine culture is sent, and staff will notify you that it looks like the antibiotic needs to be changed.  Staff will notify you if there is anything positive on the swab (or on the blood work if that has been taken at this visit). It can take 2-3 days for the tests to result, depending on the day of the week your test was taken. You will only be notified if there are any positives on the testing; test results will also go to your MyChart if you are signed up for MyChart.

## 2024-07-18 NOTE — Medical Student Note (Incomplete)
 [facility] [employer] account manager Note For educational purposes for Medical, PA and NP students only and not part of the legal medical record.   CSN: [id number] Arrival date & time: 07/18/24  1034      History   Chief Complaint Chief Complaint  Patient presents with  . Dysuria    HPI Morgan [name] is a 53 y.o. female.  Patient presents today for dysuria, urgency and frequency for appox 1 week. Sexual activity approx 3 weeks ago, shortly after began having vaginal itching, used OTC monistat, which resolve the itching.  Sexual activity approx 1 week ago, then began having the dysuria, urgency and frequency.  Used Azo which helped with the dysuria but not the urgency and frequency.  Last dose of Azo was Saturday.   Denies chest pain, shortness of breath, nausea, vomiting, diarrhea, cough, fever, chills, abdominal pain, back pain, no blood in urine or stool noted.     Dysuria   History reviewed. No pertinent past medical history.  There are no active problems to display for this patient.   Past Surgical History:  Procedure Laterality Date  . ABDOMINAL HYSTERECTOMY  01/2011   Partial  . CESAREAN SECTION    . DILATION AND CURETTAGE OF UTERUS  2012  . HERNIA REPAIR  06/06/2013  . LAPAROSCOPIC INCISIONAL / UMBILICAL / VENTRAL HERNIA REPAIR  06/06/2013   [facility]  . VENTRAL HERNIA REPAIR N/A 06/06/2013   Procedure: LAPAROSCOPIC VENTRAL HERNIA;  Surgeon: Debby LABOR. Cornett, MD;  Location: [facility];  Service: General;  Laterality: N/A;    OB History     Gravida  2   Para  2   Term  2   Preterm      AB      Living  2      SAB      IAB      Ectopic      Multiple      Live Births               Home Medications    Prior to Admission medications   Medication Sig Start Date End Date Taking? Authorizing Provider  ondansetron  (ZOFRAN ) 4 MG tablet Take 1 tablet (4 mg total) by mouth every 8 (eight) hours as needed for nausea or vomiting. 10/20/22   Jamison Rosaline LABOR, DO    Family History History reviewed. No pertinent family history.  Social History Social History   Tobacco Use  . Smoking status: Never  . Smokeless tobacco: Never  Vaping Use  . Vaping status: Never Used  Substance Use Topics  . Alcohol use: Yes    Comment: occasionally  . Drug use: Yes    Types: Marijuana     Allergies   Patient has no known allergies.   Review of Systems Review of Systems  Genitourinary:  Positive for dysuria.     Physical Exam Updated Vital Signs BP (!) 142/95   Pulse 65   Temp 98.3 F (36.8 C) (Oral)   Resp 18   SpO2 96%   Physical Exam   ED Treatments / Results  Labs (all labs ordered are listed, but only abnormal results are displayed) Labs Reviewed  POCT URINALYSIS DIP (MANUAL ENTRY) - Abnormal; Notable for the following components:      Result Value   Leukocytes, UA Trace (*)    All other components within normal limits     Medications Ordered in ED Medications - No data to display  Initial Impression / Assessment and Plan / ED Course  I have reviewed the triage vital signs and the nursing notes.  Pertinent labs & imaging results that were available during my care of the patient were reviewed by me and considered in my medical decision making (see chart for details).   Your symptomsa re consitemt with a Urinary t  Final Clinical Impressions(s) / ED Diagnoses   Final diagnoses:  None    New Prescriptions New Prescriptions   No medications on file

## 2024-07-18 NOTE — ED Provider Notes (Signed)
 [facility]    CSN: [id number] Arrival date & time: 07/18/24  1034      History   Chief Complaint Chief Complaint  Patient presents with   Dysuria    HPI Morgan [name] is a 53 y.o. female.    Dysuria  Here for dysuria and urinary frequency and urgency.  Symptoms began 4 or 5 days ago.  No fever or nausea or vomiting.  About a week ago she did have some vaginal itching but that improved with a little Monistat.  No current vaginal discharge.  She would like to be checked for sexually transmitted diseases.  NKDA  She has had hysterectomy  She is newly sexually active after a time of abstinence.   History reviewed. No pertinent past medical history.  There are no active problems to display for this patient.   Past Surgical History:  Procedure Laterality Date   ABDOMINAL HYSTERECTOMY  01/2011   Partial   CESAREAN SECTION     DILATION AND CURETTAGE OF UTERUS  2012   HERNIA REPAIR  06/06/2013   LAPAROSCOPIC INCISIONAL / UMBILICAL / VENTRAL HERNIA REPAIR  06/06/2013   [facility]   VENTRAL HERNIA REPAIR N/A 06/06/2013   Procedure: LAPAROSCOPIC VENTRAL HERNIA;  Surgeon: Debby A. Cornett, MD;  Location: [facility];  Service: General;  Laterality: N/A;    OB History     Gravida  2   Para  2   Term  2   Preterm      AB      Living  2      SAB      IAB      Ectopic      Multiple      Live Births               Home Medications    Prior to Admission medications   Medication Sig Start Date End Date Taking? Authorizing Provider  nitrofurantoin , macrocrystal-monohydrate, (MACROBID ) 100 MG capsule Take 1 capsule (100 mg total) by mouth 2 (two) times daily. 07/18/24  Yes Vonna Sharlet POUR, MD    Family History History reviewed. No pertinent family history.  Social History Social History   Tobacco Use   Smoking status: Never   Smokeless tobacco: Never  Vaping Use   Vaping status: Never Used  Substance Use Topics   Alcohol use: Yes     Comment: occasionally   Drug use: Yes    Types: Marijuana     Allergies   Patient has no known allergies.   Review of Systems Review of Systems  Genitourinary:  Positive for dysuria.     Physical Exam Triage Vital Signs ED Triage Vitals  Encounter Vitals Group     BP 07/18/24 1134 (!) 142/95     Girls Systolic BP Percentile --      Girls Diastolic BP Percentile --      Boys Systolic BP Percentile --      Boys Diastolic BP Percentile --      Pulse Rate 07/18/24 1134 65     Resp 07/18/24 1134 18     Temp 07/18/24 1134 98.3 F (36.8 C)     Temp Source 07/18/24 1134 Oral     SpO2 07/18/24 1134 96 %     Weight --      Height --      Head Circumference --      Peak Flow --      Pain Score 07/18/24 1137 0  Pain Loc --      Pain Education --      Exclude from Growth Chart --    No data found.  Updated Vital Signs BP (!) 142/95   Pulse 65   Temp 98.3 F (36.8 C) (Oral)   Resp 18   SpO2 96%   Visual Acuity Right Eye Distance:   Left Eye Distance:   Bilateral Distance:    Right Eye Near:   Left Eye Near:    Bilateral Near:     Physical Exam Vitals reviewed.  Constitutional:      General: She is not in acute distress.    Appearance: She is not toxic-appearing.  HENT:     Mouth/Throat:     Mouth: Mucous membranes are moist.  Abdominal:     Palpations: Abdomen is soft.     Tenderness: There is no right CVA tenderness or left CVA tenderness.  Skin:    Coloration: Skin is not pale.  Neurological:     General: No focal deficit present.     Mental Status: She is alert and oriented to person, place, and time.  Psychiatric:        Behavior: Behavior normal.      UC Treatments / Results  Labs (all labs ordered are listed, but only abnormal results are displayed) Labs Reviewed  POCT URINALYSIS DIP (MANUAL ENTRY) - Abnormal; Notable for the following components:      Result Value   Leukocytes, UA Trace (*)    All other components within normal  limits  URINE CULTURE  CERVICOVAGINAL ANCILLARY ONLY    EKG   Radiology No results found.  Procedures Procedures (including critical care time)  Medications Ordered in UC Medications - No data to display  Initial Impression / Assessment and Plan / UC Course  I have reviewed the triage vital signs and the nursing notes.  Pertinent labs & imaging results that were available during my care of the patient were reviewed by me and considered in my medical decision making (see chart for details).     Urinalysis shows a trace of leukocytes.  Vaginal self swab is done, and we will notify of any positives on that and treat per protocol.  She declines my offer of testing for HIV and RPR.  Macrobid  is sent in for possible UTI.  Urine culture is sent and we will notify her if the antibodies to be changed or stopped Final Clinical Impressions(s) / UC Diagnoses   Final diagnoses:  Cystitis  Potential exposure to STD     Discharge Instructions      Urinalysis showed some white blood cells, which could be consistent with a bladder infection.  Take nitrofurantoin  100 mg--1 capsule 2 times daily for 5 days  Urine culture is sent, and staff will notify you that it looks like the antibiotic needs to be changed.  Staff will notify you if there is anything positive on the swab (or on the blood work if that has been taken at this visit). It can take 2-3 days for the tests to result, depending on the day of the week your test was taken. You will only be notified if there are any positives on the testing; test results will also go to your MyChart if you are signed up for MyChart.        ED Prescriptions     Medication Sig Dispense Auth. Provider   nitrofurantoin , macrocrystal-monohydrate, (MACROBID ) 100 MG capsule Take 1 capsule (100 mg total)  by mouth 2 (two) times daily. 10 capsule Tucker Steedley K, MD      PDMP not reviewed this encounter.   Vonna Sharlet POUR, MD 07/18/24  [phone]

## 2024-07-19 ENCOUNTER — Ambulatory Visit (HOSPITAL_COMMUNITY): Payer: Self-pay

## 2024-07-19 LAB — CERVICOVAGINAL ANCILLARY ONLY
Bacterial Vaginitis (gardnerella): POSITIVE — AB
Candida Glabrata: NEGATIVE
Candida Vaginitis: NEGATIVE
Chlamydia: NEGATIVE
Comment: NEGATIVE
Comment: NEGATIVE
Comment: NEGATIVE
Comment: NEGATIVE
Comment: NEGATIVE
Comment: NORMAL
Neisseria Gonorrhea: NEGATIVE
Trichomonas: NEGATIVE

## 2024-07-20 LAB — URINE CULTURE: Culture: 70000 — AB
